# Patient Record
Sex: Male | Born: 1970 | Race: Black or African American | Hispanic: No | Smoking: Current every day smoker
Health system: Southern US, Community
[De-identification: ages and names within clinical notes are randomized; demographics above are authoritative.]

## PROBLEM LIST (undated history)

## (undated) DIAGNOSIS — N189 Chronic kidney disease, unspecified: Secondary | ICD-10-CM

## (undated) DIAGNOSIS — I10 Essential (primary) hypertension: Secondary | ICD-10-CM

## (undated) DIAGNOSIS — E7800 Pure hypercholesterolemia, unspecified: Secondary | ICD-10-CM

## (undated) DIAGNOSIS — I639 Cerebral infarction, unspecified: Secondary | ICD-10-CM

## (undated) HISTORY — DX: Chronic kidney disease, unspecified: N18.9

---

## 2001-03-08 ENCOUNTER — Emergency Department (HOSPITAL_COMMUNITY): Admission: EM | Admit: 2001-03-08 | Discharge: 2001-03-08 | Payer: Self-pay

## 2001-03-10 ENCOUNTER — Emergency Department (HOSPITAL_COMMUNITY): Admission: EM | Admit: 2001-03-10 | Discharge: 2001-03-10 | Payer: Self-pay | Admitting: Emergency Medicine

## 2002-07-23 ENCOUNTER — Emergency Department (HOSPITAL_COMMUNITY): Admission: EM | Admit: 2002-07-23 | Discharge: 2002-07-24 | Payer: Self-pay | Admitting: Emergency Medicine

## 2002-07-24 ENCOUNTER — Encounter: Payer: Self-pay | Admitting: Emergency Medicine

## 2006-11-21 ENCOUNTER — Encounter: Payer: Self-pay | Admitting: Cardiology

## 2006-11-21 ENCOUNTER — Ambulatory Visit: Payer: Self-pay

## 2009-02-04 ENCOUNTER — Inpatient Hospital Stay (HOSPITAL_COMMUNITY): Admission: EM | Admit: 2009-02-04 | Discharge: 2009-02-06 | Payer: Self-pay | Admitting: Emergency Medicine

## 2009-02-04 ENCOUNTER — Ambulatory Visit: Payer: Self-pay | Admitting: Family Medicine

## 2010-12-29 ENCOUNTER — Other Ambulatory Visit: Payer: Self-pay | Admitting: Cardiovascular Disease

## 2010-12-29 DIAGNOSIS — I10 Essential (primary) hypertension: Secondary | ICD-10-CM

## 2010-12-30 ENCOUNTER — Ambulatory Visit
Admission: RE | Admit: 2010-12-30 | Discharge: 2010-12-30 | Disposition: A | Discharge status: Home / Self Care | Payer: BC Managed Care – PPO | Source: Ambulatory Visit | Attending: Cardiovascular Disease | Admitting: Cardiovascular Disease

## 2010-12-30 ENCOUNTER — Other Ambulatory Visit: Payer: Self-pay | Admitting: Cardiovascular Disease

## 2010-12-30 DIAGNOSIS — I10 Essential (primary) hypertension: Secondary | ICD-10-CM

## 2011-01-22 ENCOUNTER — Inpatient Hospital Stay (HOSPITAL_COMMUNITY)
Admission: EM | Admit: 2011-01-22 | Discharge: 2011-01-24 | DRG: 014 | Disposition: A | Discharge status: Home / Self Care | Payer: BC Managed Care – HMO | Attending: Internal Medicine | Admitting: Internal Medicine

## 2011-01-22 ENCOUNTER — Emergency Department (HOSPITAL_COMMUNITY): Payer: BC Managed Care – HMO

## 2011-01-22 DIAGNOSIS — I10 Essential (primary) hypertension: Secondary | ICD-10-CM | POA: Diagnosis present

## 2011-01-22 DIAGNOSIS — Z794 Long term (current) use of insulin: Secondary | ICD-10-CM

## 2011-01-22 DIAGNOSIS — F17200 Nicotine dependence, unspecified, uncomplicated: Secondary | ICD-10-CM | POA: Diagnosis present

## 2011-01-22 DIAGNOSIS — E785 Hyperlipidemia, unspecified: Secondary | ICD-10-CM | POA: Diagnosis present

## 2011-01-22 DIAGNOSIS — I6350 Cerebral infarction due to unspecified occlusion or stenosis of unspecified cerebral artery: Principal | ICD-10-CM | POA: Diagnosis present

## 2011-01-22 DIAGNOSIS — E119 Type 2 diabetes mellitus without complications: Secondary | ICD-10-CM | POA: Diagnosis present

## 2011-01-22 DIAGNOSIS — Z7982 Long term (current) use of aspirin: Secondary | ICD-10-CM

## 2011-01-22 LAB — CK TOTAL AND CKMB (NOT AT ARMC)
CK, MB: 1.1 ng/mL (ref 0.3–4.0)
CK, MB: 1 ng/mL (ref 0.3–4.0)
Relative Index: 0.7 (ref 0.0–2.5)
Relative Index: 0.7 (ref 0.0–2.5)
Total CK: 165 U/L (ref 7–232)
Total CK: 143 U/L (ref 7–232)

## 2011-01-22 LAB — DIFFERENTIAL
Basophils Absolute: 0 10*3/uL (ref 0.0–0.1)
Eosinophils Relative: 2 % (ref 0–5)
Lymphocytes Relative: 18 % (ref 12–46)
Lymphs Abs: 1.8 10*3/uL (ref 0.7–4.0)
Neutro Abs: 7.4 10*3/uL (ref 1.7–7.7)

## 2011-01-22 LAB — COMPREHENSIVE METABOLIC PANEL
AST: 17 U/L (ref 0–37)
Albumin: 3.4 g/dL — ABNORMAL LOW (ref 3.5–5.2)
BUN: 22 mg/dL (ref 6–23)
Calcium: 8.9 mg/dL (ref 8.4–10.5)
Chloride: 106 mEq/L (ref 96–112)
Creatinine, Ser: 1.47 mg/dL (ref 0.4–1.5)
GFR calc Af Amer: >60 mL/min (ref >60)
Total Bilirubin: 0.6 mg/dL (ref 0.3–1.2)
Total Protein: 6.4 g/dL (ref 6.0–8.3)

## 2011-01-22 LAB — CBC
HCT: 36.8 % — ABNORMAL LOW (ref 39.0–52.0)
Hemoglobin: 12.8 g/dL — ABNORMAL LOW (ref 13.0–17.0)
MCV: 90 fL (ref 78.0–100.0)
RDW: 12.8 % (ref 11.5–15.5)
WBC: 9.7 10*3/uL (ref 4.0–10.5)

## 2011-01-22 LAB — APTT: aPTT: 23 seconds — ABNORMAL LOW (ref 24–37)

## 2011-01-22 LAB — PROTIME-INR: INR: 0.93 (ref 0.00–1.49)

## 2011-01-22 LAB — TROPONIN I: Troponin I: 0.02 ng/mL (ref 0.00–0.06)

## 2011-01-23 ENCOUNTER — Inpatient Hospital Stay (HOSPITAL_COMMUNITY): Payer: BC Managed Care – HMO

## 2011-01-23 DIAGNOSIS — I6789 Other cerebrovascular disease: Secondary | ICD-10-CM

## 2011-01-23 LAB — LIPID PANEL
Cholesterol: 109 mg/dL (ref 0–200)
Total CHOL/HDL Ratio: 3.2 RATIO
VLDL: 19 mg/dL (ref 0–40)

## 2011-01-23 LAB — CBC
MCH: 31.5 pg (ref 26.0–34.0)
MCHC: 35.2 g/dL (ref 30.0–36.0)
MCV: 89.3 fL (ref 78.0–100.0)
Platelets: 256 10*3/uL (ref 150–400)
RBC: 3.91 MIL/uL — ABNORMAL LOW (ref 4.22–5.81)

## 2011-01-23 LAB — HEMOGLOBIN A1C
Hgb A1c MFr Bld: 10.7 % — ABNORMAL HIGH (ref <5.7)
Mean Plasma Glucose: 260 mg/dL — ABNORMAL HIGH (ref <117)

## 2011-01-23 LAB — RAPID URINE DRUG SCREEN, HOSP PERFORMED
Amphetamines: NOT DETECTED
Cocaine: NOT DETECTED
Opiates: NOT DETECTED
Tetrahydrocannabinol: POSITIVE — AB

## 2011-01-23 LAB — BASIC METABOLIC PANEL
BUN: 15 mg/dL (ref 6–23)
Calcium: 8.7 mg/dL (ref 8.4–10.5)
Chloride: 109 mEq/L (ref 96–112)
Creatinine, Ser: 1.28 mg/dL (ref 0.4–1.5)
GFR calc Af Amer: >60 mL/min (ref >60)

## 2011-01-23 LAB — GLUCOSE, CAPILLARY
Glucose-Capillary: 139 mg/dL — ABNORMAL HIGH (ref 70–99)
Glucose-Capillary: 95 mg/dL (ref 70–99)
Glucose-Capillary: 82 mg/dL (ref 70–99)

## 2011-01-23 LAB — C3 COMPLEMENT: C3 Complement: 125 mg/dL (ref 88–201)

## 2011-01-23 LAB — C4 COMPLEMENT: Complement C4, Body Fluid: 34 mg/dL (ref 16–47)

## 2011-01-24 LAB — LUPUS ANTICOAGULANT PANEL
DRVVT: 50.7 secs — ABNORMAL HIGH (ref 36.2–44.3)
Lupus Anticoagulant: NOT DETECTED
PTT Lupus Anticoagulant: 34.7 secs (ref 30.0–45.6)

## 2011-01-24 LAB — HOMOCYSTEINE: Homocysteine: 14.8 umol/L (ref 4.0–15.4)

## 2011-01-24 LAB — ANA: Anti Nuclear Antibody(ANA): NEGATIVE

## 2011-01-24 LAB — HIV ANTIBODY (ROUTINE TESTING W REFLEX): HIV: NONREACTIVE

## 2011-01-26 LAB — FACTOR 5 LEIDEN

## 2011-01-26 LAB — COMPLEMENT, TOTAL: Compl, Total (CH50): >60 U/mL — ABNORMAL HIGH (ref 31–60)

## 2011-01-26 LAB — BETA-2-GLYCOPROTEIN I ABS, IGG/M/A: Beta-2 Glyco I IgG: 0 G Units (ref <20)

## 2011-01-26 LAB — PROTEIN C, TOTAL: Protein C, Total: >150 % — ABNORMAL HIGH (ref 70–140)

## 2011-01-26 LAB — PROTEIN S, TOTAL: Protein S Ag, Total: 127 % (ref 70–140)

## 2011-01-29 NOTE — Consult Note (Signed)
NAME:  RAYMON, GATEWOOD NO.:  [id number]  MEDICAL RECORD NO.:  [id number]           PATIENT TYPE:  I  LOCATION:  3037                         FACILITY:  [facility]  PHYSICIAN:  Alexis Goodell, MD    DATE OF BIRTH:  [birth year]  DATE OF CONSULTATION:  01/22/2011 DATE OF DISCHARGE:                                CONSULTATION   HISTORY:  Mr. Haser is a 40 year old male that was last seen normal approximately 1:00 a.m. in the morning.  He went to sleep about that time.  Awakened about at 11:00 a.m. today.  Was not feeling right on the right side and was having some dizziness as well.  Was noted by family members while in the bathroom to be making a lot of noise and did not handle himself appropriately.  When they went to him he was having some right-sided weakness.  They decided to eat at that time and laid down again.  When they awakened later his symptoms had not improved and they presented for evaluation.  Code stroke was called for the patient while en route.  PAST MEDICAL HISTORY: 1. Hypertension. 2. Diabetes. 3. Hypercholesterolemia.  MEDICATIONS: 1. Amlodipine. 2. Clonidine. 3. Lantus. 4. Lisinopril. 5. Metoprolol. 6. Pravastatin  ALLERGIES:  NO KNOWN DRUG ALLERGIES.  SOCIAL HISTORY:  The patient works in Psychologist, educational.  He smokes.  He drinks on occasion and had quite a bit of alcohol this weekend.  He smokes marijuana.  PHYSICAL EXAMINATION:  VITAL SIGNS:  Blood pressure 142/93, heart rate 72, respiratory rate 20, temperature 99, O2 sat 100% on room air. On mental status testing, the patient is alert and oriented.  He can follow commands without difficulty.  Speech is fluent.  There is some mild dysarthria.  On cranial nerve testing, II disk flat bilaterally. Visual fields grossly intact.  III, IV and VI extraocular movements intact.  V and VII right facial droop.  VIII grossly intact.  IX and VII positive gag.  XI bilateral shoulder shrug.  XII  midline tongue extension.  On motor exam, the patient has 5-/5 strength on the right. He has 5/5 on the left.  On sensory testing, pinprick and light touch are intact bilaterally.  Deep tendon reflexes are 3+ in the right upper extremity and 2+ otherwise.  Plantars are downgoing on the left and equivocal on the right.  On cerebellar testing, finger-to-nose and heel- to-shin intact with only some difficulty noted on the right upper extremity secondary to strength.  LABORATORY DATA:  Platelet count 265,000, white blood cell count 9.7, hemoglobin and hematocrit 12.8 and 36.8 respectively.  PT, INR and PTT are 12.7, 0.93 and 23.  CBG 139.  Head CT of the brain shows no acute changes.  ASSESSMENT:  Mr. Plona is a 40 year old male that presents with mostly motor findings on exam.  He likely has a lacunar event.  Presenting NIH stroke scale of 5.  Premorbid ranking scale of zero.  Due to his head CT being unremarkable, the patient is presumed to have a subcortical event that is likely small vessel in nature and related to his  risk factors of diabetes, hypertension and tobacco abuse.  He is on no anticoagulant at home.  He is not a t-PA candidate secondary to the time of presentation being greater than 12 hours.  PLAN: 1. Beta blocker for BP control. 2. Enteric-coated aspirin 325 mg daily for stroke prophylaxis. 3. Echo and carotid doppler. 4. PT, OT consult. 5. Swallow evaluation. 6. Smoking cessation.          ______________________________ Alexis Goodell, MD     LR/MEDQ  D:  01/22/2011  T:  01/23/2011  Job:  [id number]  Electronically Signed by Alexis Goodell MD on 01/29/2011 11:16:13 PM

## 2011-01-31 ENCOUNTER — Ambulatory Visit: Payer: BC Managed Care – PPO | Attending: Cardiovascular Disease | Admitting: Physical Therapy

## 2011-01-31 DIAGNOSIS — IMO0001 Reserved for inherently not codable concepts without codable children: Secondary | ICD-10-CM | POA: Insufficient documentation

## 2011-01-31 DIAGNOSIS — R269 Unspecified abnormalities of gait and mobility: Secondary | ICD-10-CM | POA: Insufficient documentation

## 2011-01-31 DIAGNOSIS — I69998 Other sequelae following unspecified cerebrovascular disease: Secondary | ICD-10-CM | POA: Insufficient documentation

## 2011-01-31 DIAGNOSIS — M6281 Muscle weakness (generalized): Secondary | ICD-10-CM | POA: Insufficient documentation

## 2011-01-31 NOTE — H&P (Signed)
NAME:  Keith [name], Keith [name] NO.:  [id number]  MEDICAL RECORD NO.:  [id number]           PATIENT TYPE:  I  LOCATION:  3037                         FACILITY:  [facility]  PHYSICIAN:  Kathie Dike, MD     DATE OF BIRTH:  1971/11/19  DATE OF ADMISSION:  01/22/2011 DATE OF DISCHARGE:                             HISTORY & PHYSICAL   PRIMARY CARE PHYSICIAN:  Birdie Riddle, MD  CHIEF COMPLAINT:  Right-sided weakness.  HISTORY OF PRESENT ILLNESS:  This is a 40 year old gentleman who presents to the ER with right-sided weakness.  The patient awoke with his symptoms this morning.  He was last found to be normal when he went to bed last night.  He describes right upper extremity and lower extremity weakness.  He also has some dizziness.  He has some numbness in the tips of his finger on his right hand.  He denies any changes in vision.  He has not had any headache.  His family noted that he was slurring his speech and had some right-sided facial droop.  He reported to the ER and code stroke was called.  He was seen by Dr. Doy Mince from Neurology and was not a candidate for TPA since he was last seen normal the night before and was out of the window.  He has been referred for further admission.  PAST MEDICAL HISTORY: 1. Hypertension. 2. Insulin-dependent diabetes. 3. Tobacco abuse. 4. Hyperlipidemia.  ALLERGIES:  No known drug allergies.  MEDICATIONS PRIOR TO ADMISSION: 1. Glipizide 10 mg twice daily. 2. Metformin 500 mg twice daily. 3. Clonidine 0.1 mg daily. 4. Lisinopril/hydrochlorothiazide 10/12.5 mg daily. 5. Pravachol 40 mg daily. 6. Lantus 30 units at bedtime. 7. Amlodipine 10 mg twice daily. 8. Metoprolol 50 mg twice daily.  FAMILY HISTORY:  The patient's mother had a stroke in her 61s.  High blood pressure and diabetes run in his family and is otherwise negative.  SOCIAL HISTORY:  The patient is an active smoker.  He does not clearly see how much he  smokes.  Says he drinks alcohol on occasion, but not daily.  Denies any illicit drug use.  REVIEW OF SYSTEMS:  All systems have been reviewed and pertinent positives are stated in the HPI.  PHYSICAL EXAMINATION:  VITAL SIGNS:  Blood pressure 135/90, heart rate of 72, respiratory rate of 17, temperature of 99, pulse ox 100% on room air. GENERAL:  The patient is in no acute distress, lying comfortably in bed. HEENT:  Normocephalic, atraumatic.  Pupils are equal, round, and reactive to light. NECK:  Supple. CHEST:  Clear to auscultation bilaterally. CARDIAC:  S1 and S2 with a regular rate and rhythm. ABDOMEN:  Soft, nontender.  Bowel sounds are active. EXTREMITIES:  No cyanosis, clubbing, or edema. NEUROLOGIC:  The patient is alert and oriented x3.  Speech is slightly dysarthric.  He does have a right-sided facial droop.  Strength is 5/5 bilaterally in the upper and lower extremities.  ASSESSMENT/PLAN: 1. Right-sided weakness, rule out cerebrovascular accident.  The     patient had a CT head that was negative in the  emergency room.  We     will admit him for further stroke workup including MRI, carotid     Dopplers, and 2-D echo.  He will be monitored on telemetry and     continued on aspirin for stroke prophylaxis.  He will be seen by PT     and OT. 2. Hypertension.  We will continue his outpatient medications. 3. Insulin-dependent diabetes.  We will continue his outpatient     regimen and supplement him with sliding scale. 4. Hyperlipidemia.  We will check a fasting lipid panel as well as     continue his statin.  Further orders will be per the clinical     course.     Kathie Dike, MD     JM/MEDQ  D:  01/22/2011  T:  01/23/2011  Job:  [id number]  cc:   Birdie Riddle, M.D.  Electronically Signed by Kathie Dike  on 01/31/2011 09:45:36 PM

## 2011-02-01 NOTE — Discharge Summary (Signed)
NAME:  Keith [name], Keith [name]                 ACCOUNT NO.:  [id number]  MEDICAL RECORD NO.:  [id number]           PATIENT TYPE:  I  LOCATION:  3037                         FACILITY:  [facility]  PHYSICIAN:  Estill Cotta, MD       DATE OF BIRTH:  25-Oct-1971  DATE OF ADMISSION:  01/22/2011 DATE OF DISCHARGE:  01/24/2011                              DISCHARGE SUMMARY   PRIMARY CARE PHYSICIAN:  Birdie Riddle, MD  DISCHARGE DIAGNOSES: 1. 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 2. Hypertension. 3. Diabetes mellitus. 4. Hyperlipidemia. 5. Nicotine use.  CONSULTATIONS:  Neurology Service, Dr. Leonie Man.  DISCHARGE MEDICATIONS: 1. Aspirin 325 mg p.o. daily. 2. Amlodipine 10 mg twice daily. 3. Clonidine 0.1 mg p.o. daily. 4. Glipizide 10 mg p.o. b.i.d. 5. Lantus 30 units subcu q.p.m. 6. Lisinopril and hydrochlorothiazide 10/12.5 one tablet p.o. daily. 7. Metformin 500 mg p.o. b.i.d. 8. Metoprolol 50 mg p.o. b.i.d. 9. Pravachol 40 mg p.o. daily.  BRIEF HISTORY OF PRESENT ILLNESS:  At the time of admission, Keith [name] is a 40 year old male who was admitted with history of diabetes, hypertension, hyperlipidemia, presented to the emergency room with right- sided weakness.  The patient awoke with this symptoms.  On the day of admission, he was found to be normal.  When he went to the bed at night before, he did have right upper extremity and lower extremity weakness with some dizziness and numbness in the tip of his fingers on his right hand.  He denied any changes in the vision.  His family noted that he was slurring his speech and had some right-sided facial droop.  The patient was not deemed as a candidate for TPA since he was last seen normal on night before and was out of window.  He was seen by Dr. Doy Mince from Neurology in the emergency room.  RADIOLOGICAL DATA:  CT head without contrast on March 4, normal CT of the head without contrast.  MRI of  the head showed one multifocal patchy acute infarcts in the left MCA territory, most pronounced at the basal ganglia.  There is a small associated focus of hemorrhage in the left central white matter.  No associated mass effect.  Negative MRI of head and brain, otherwise mild paranasal sinus inflammatory changes.  MRA, occluded left MCA at its region, left ACA region remains patent, no residual left MCA territory still observed.  Negative intracranial MRA. A 2D-echo on March 5, mild concentric LVH, EF of 60-65%, normal wall motion.  No evidence of thrombus in the atrial cavity or appendage of left atrium or right atrium.  No right-to-left atrial level shunt.  This includes a TEE as well.  Carotid Dopplers, no ICA stenosis, vertebral antegrade.  PERTINENT LAB DIAGNOSTIC DATA:  Hypercoagulable workup essentially was negative, HIV nonreactive, C3 and C4 normal, RPR nonreactive.  Urine drug screen did show THC, ESR 20.  Lipid profile; cholesterol 109, LDL 56, HbA1c 10.7 and a mean plasma glucose of 260.  BRIEF HOSPITALIZATION COURSE:  Keith [name] is a 40 year old male with multiple risk factors  including hypertension, hyperlipidemia and diabetes, smoking, presented with  left MCA infarct. 1. Acute left MCA CVA.  The patient was admitted to the Neurology     floor and underwent this total workup as dictated above.  The     patient was started on full-dose aspirin.  He also underwent     physical therapy evaluation and felt that outpatient CT is     recommended, rehab physical therapy will also be arranged.  Urine     drug screen was positive for THC.  The patient was counseled     strongly on smoking cessation and any drugs.  Vasculitis workup was     negative except for mildly elevated ESR at 20 with normal being     less than 16, C3 and C4 levels were essentially normal.  The     patient will be discharged home today.  He was continued on a     sliding-scale insulin and Lantus while  inpatient.  He will follow     up with his primary care physician Dr. Doylene Canard.  PHYSICAL EXAMINATION:  VITAL SIGNS:  At the time of the discharge; temperature 98.7, pulse 55, respirations 16, blood pressure 136/79, O2 sats 97% on room air. GENERAL:  The patient is alert, awake and oriented x3 not in acute distress. HEENT:  Anicteric sclerae.  Conjunctivae clear.  Pupils are reactive to light and accommodation.  EOMI.  No dysarthria. CVS:  S1 and S2 clear. CHEST:  Clear to auscultation bilaterally. ABDOMEN:  Soft, nontender, nondistended.  Normal bowel sounds.  Discharge followup with Dr. Doylene Canard in next 2 weeks and Dr. Antony Contras Neurology Service in next 3-4 weeks.  Discharge time 35 minutes.     Estill Cotta, MD     RR/MEDQ  D:  01/24/2011  T:  01/25/2011  Job:  [id number]  cc:   Pramod P. Leonie Man, MD Birdie Riddle, M.D.  Electronically Signed by Ramin Zoll  on 02/01/2011 07:51:31 AM

## 2011-02-03 NOTE — Consult Note (Signed)
NAME:  [name], [name] NO.:  [id number]  MEDICAL RECORD NO.:  [id number]           PATIENT TYPE:  I  LOCATION:  3037                         FACILITY:  [facility]  PHYSICIAN:  Roena Sassaman P. Leonie Man, MD    DATE OF BIRTH:  1971-05-15  DATE OF CONSULTATION: DATE OF DISCHARGE:                                CONSULTATION   REFERRING PHYSICIAN:  [facility]  REASON FOR REFERRAL:  Stroke.  HISTORY OF PRESENT ILLNESS:  Mr. Keith [name] is a 40 year old African American gentleman who was admitted with new-onset right-sided weakness.  The patient states he woke up in the morning at 11 a.m. with right-sided weakness.  He slept at 11 p.m. the night prior and states that he did not get up in the middle of the night.  He delayed coming to the hospital until later on in the afternoon by the time Dr. Doy Mince saw him on consultation.  It was more than 12 hours since his last seen normal, and he had only mild NIH stroke scale of 5, and hence he was not eligible for any of the stroke trials as well.  Code Stroke had been called en route, but after that time of onset was clearly determined to be beyond 12 hours, it was cancelled.  He has improved slightly since admission, but still has some weakness persisting, mainly in his hands. He has no prior history of stroke, TIA, seizures, significant neurological problems.  PAST MEDICAL HISTORY: 1. Hypertension. 2. Diabetes. 3. Hyperlipidemia. 4. Smoking.  HOME MEDICATIONS:  Amlodipine, clonidine, Lantus, lisinopril, metoprolol, pravastatin.  ALLERGIES TO MEDICATIONS:  None listed.  REVIEW OF SYSTEMS:  Not significant for any recent fever, cough, chest pain, diarrhea, or illness.  SOCIAL HISTORY:  The patient works in Psychologist, educational.  He admits to smoking marijuana and cigarettes, and occasionally alcohol.  He denies cocaine.  FAMILY HISTORY:  Not significant for anybody with neurological problems.  PHYSICAL EXAMINATION:  GENERAL:   Reveals pleasant young African American gentleman who appears currently not in distress. VITAL SIGNS:  Temperature 98.1, pulse rate 53 per minute and regular, respiratory rate 16 per minute, blood pressure 122/67 now, but on admission was elevated at 167/88.  Oxygen sats are 97% on room air. HEAD:  Nontraumatic. NECK:  Supple.  There is no bruit.  Hearing appears normal. CARDIAC EXAM:  No murmur or gallop. LUNGS:  Clear to auscultation. NEUROLOGICAL EXAM:  The patient is awake, alert, currently oriented to time, place, and person.  His speech and language appear normal.  He does have slight dysfluency, but can speak sentences, name, and repeat quite well.  There is no facial weakness.  He has full fields of vision bilaterally.  He has no upper extremity drift, but there is significant weakness of the right grip and intrinsic hand muscles.  He orbits the left over right upper extremity.  He has minimum right lower extremity drift and 4+/5 weakness of ankle dorsiflexors.  Coordination is slow, but accurate on the right.  On NIH stroke scale today, he scored only 1.  DATA REVIEWED:  The MRI scan of the brain  done earlier today shows patchy acute infarcts in the left middle cerebral artery distribution, mainly in the left basal ganglia plus some smaller areas in the left frontal cortex as well.  MRA of the brain shows abrupt occlusion of the left middle cerebral artery in the M1 portion.  LABORATORY DATA:  Total cholesterol is 109, triglyceride 95, HDL 34, LDL 56.  Hemoglobin A1c is elevated at 10.7 with a mean plasma glucose of 260.  Basic metabolic panel labs are unremarkable.  CBC is also normal.  IMPRESSION:  A 40 year old gentleman with left middle cerebral artery branch infarct secondary to left middle cerebral artery occlusion, etiology to be determined, but he clearly has multiple vascular risk factors of hypertension, diabetes, hyperlipidemia, and smoking.  PLAN:  Would  recommend continuing aspirin for now, check transesophageal echocardiogram for cardiac source of embolism as well as labs for vasculitis, HIV, and hypercoagulable panel given his young age. Physical and Occupational Therapy consults.  I have also counseled him to quit smoking.  Check urine drug screen as well.  I will be happy to follow the patient in consults.  Aggressive treatment of diabetes with hemoglobin A1c goal below 6.5.  I will be happy to follow the patient in consults.  Kindly call for questions.     Tewana Bohlen P. Leonie Man, MD     PPS/MEDQ  D:  01/23/2011  T:  01/24/2011  Job:  [id number]  Electronically Signed by Antony Contras MD on 02/03/2011 11:16:36 AM

## 2011-02-07 ENCOUNTER — Ambulatory Visit: Payer: BC Managed Care – PPO | Admitting: Physical Therapy

## 2011-02-10 ENCOUNTER — Ambulatory Visit: Payer: BC Managed Care – PPO | Admitting: Physical Therapy

## 2011-02-13 ENCOUNTER — Ambulatory Visit: Payer: BC Managed Care – PPO | Admitting: Physical Therapy

## 2011-02-13 ENCOUNTER — Ambulatory Visit: Payer: BC Managed Care – PPO | Admitting: Occupational Therapy

## 2011-02-15 ENCOUNTER — Ambulatory Visit: Payer: BC Managed Care – PPO | Admitting: Physical Therapy

## 2011-02-15 ENCOUNTER — Ambulatory Visit: Payer: BC Managed Care – PPO | Admitting: Occupational Therapy

## 2011-02-21 ENCOUNTER — Ambulatory Visit: Payer: BC Managed Care – PPO | Attending: Cardiovascular Disease | Admitting: Physical Therapy

## 2011-02-21 ENCOUNTER — Ambulatory Visit: Payer: BC Managed Care – PPO | Admitting: Occupational Therapy

## 2011-02-21 DIAGNOSIS — R269 Unspecified abnormalities of gait and mobility: Secondary | ICD-10-CM | POA: Insufficient documentation

## 2011-02-21 DIAGNOSIS — M6281 Muscle weakness (generalized): Secondary | ICD-10-CM | POA: Insufficient documentation

## 2011-02-21 DIAGNOSIS — IMO0001 Reserved for inherently not codable concepts without codable children: Secondary | ICD-10-CM | POA: Insufficient documentation

## 2011-02-21 DIAGNOSIS — I69998 Other sequelae following unspecified cerebrovascular disease: Secondary | ICD-10-CM | POA: Insufficient documentation

## 2011-02-22 ENCOUNTER — Ambulatory Visit: Payer: BC Managed Care – PPO | Admitting: Physical Therapy

## 2011-02-22 ENCOUNTER — Ambulatory Visit: Payer: BC Managed Care – PPO | Admitting: Occupational Therapy

## 2011-03-01 ENCOUNTER — Ambulatory Visit: Payer: BC Managed Care – PPO | Admitting: Physical Therapy

## 2011-03-01 ENCOUNTER — Ambulatory Visit: Payer: BC Managed Care – PPO | Admitting: Occupational Therapy

## 2011-03-02 LAB — URINE MICROSCOPIC-ADD ON

## 2011-03-02 LAB — URINALYSIS, ROUTINE W REFLEX MICROSCOPIC
Leukocytes, UA: NEGATIVE
Nitrite: NEGATIVE
Specific Gravity, Urine: 1.035 — ABNORMAL HIGH (ref 1.005–1.030)
pH: 5.5 (ref 5.0–8.0)

## 2011-03-02 LAB — POCT I-STAT 3, ART BLOOD GAS (G3+)
Patient temperature: 37
TCO2: 23 mmol/L (ref 0–100)
pCO2 arterial: 35 mmHg (ref 35.0–45.0)
pH, Arterial: 7.407 (ref 7.350–7.450)

## 2011-03-02 LAB — BASIC METABOLIC PANEL
BUN: 24 mg/dL — ABNORMAL HIGH (ref 6–23)
BUN: 26 mg/dL — ABNORMAL HIGH (ref 6–23)
CO2: 25 mEq/L (ref 19–32)
CO2: 26 mEq/L (ref 19–32)
Calcium: 9.1 mg/dL (ref 8.4–10.5)
Calcium: 9.2 mg/dL (ref 8.4–10.5)
Chloride: 101 mEq/L (ref 96–112)
Chloride: 103 mEq/L (ref 96–112)
Chloride: 93 mEq/L — ABNORMAL LOW (ref 96–112)
Creatinine, Ser: 1.75 mg/dL — ABNORMAL HIGH (ref 0.4–1.5)
Creatinine, Ser: 1.96 mg/dL — ABNORMAL HIGH (ref 0.4–1.5)
GFR calc Af Amer: 46 mL/min — ABNORMAL LOW (ref >60)
GFR calc Af Amer: 53 mL/min — ABNORMAL LOW (ref >60)
GFR calc Af Amer: >60 mL/min (ref >60)
Potassium: 3.4 mEq/L — ABNORMAL LOW (ref 3.5–5.1)
Sodium: 133 mEq/L — ABNORMAL LOW (ref 135–145)

## 2011-03-02 LAB — DIFFERENTIAL
Basophils Relative: 0 % (ref 0–1)
Lymphs Abs: 1.9 10*3/uL (ref 0.7–4.0)
Monocytes Absolute: 0.5 10*3/uL (ref 0.1–1.0)
Monocytes Relative: 5 % (ref 3–12)
Neutro Abs: 6.6 10*3/uL (ref 1.7–7.7)

## 2011-03-02 LAB — CBC
HCT: 38.2 % — ABNORMAL LOW (ref 39.0–52.0)
Hemoglobin: 13.2 g/dL (ref 13.0–17.0)
MCHC: 34.5 g/dL (ref 30.0–36.0)
MCHC: 34.7 g/dL (ref 30.0–36.0)
MCV: 93.4 fL (ref 78.0–100.0)
Platelets: 188 10*3/uL (ref 150–400)
RBC: 4.09 MIL/uL — ABNORMAL LOW (ref 4.22–5.81)
RDW: 12.9 % (ref 11.5–15.5)
WBC: 8.4 10*3/uL (ref 4.0–10.5)

## 2011-03-02 LAB — LIPID PANEL
Cholesterol: 207 mg/dL — ABNORMAL HIGH (ref 0–200)
Total CHOL/HDL Ratio: 6.9 RATIO
VLDL: 35 mg/dL (ref 0–40)

## 2011-03-02 LAB — COMPREHENSIVE METABOLIC PANEL
ALT: 33 U/L (ref 0–53)
Albumin: 4.4 g/dL (ref 3.5–5.2)
Alkaline Phosphatase: 104 U/L (ref 39–117)
BUN: 26 mg/dL — ABNORMAL HIGH (ref 6–23)
Calcium: 9.5 mg/dL (ref 8.4–10.5)
Potassium: 4.3 mEq/L (ref 3.5–5.1)
Sodium: 127 mEq/L — ABNORMAL LOW (ref 135–145)
Total Protein: 7.8 g/dL (ref 6.0–8.3)

## 2011-03-02 LAB — GLUCOSE, CAPILLARY
Glucose-Capillary: 222 mg/dL — ABNORMAL HIGH (ref 70–99)
Glucose-Capillary: 245 mg/dL — ABNORMAL HIGH (ref 70–99)
Glucose-Capillary: 370 mg/dL — ABNORMAL HIGH (ref 70–99)
Glucose-Capillary: 439 mg/dL — ABNORMAL HIGH (ref 70–99)
Glucose-Capillary: 99 mg/dL (ref 70–99)

## 2011-03-02 LAB — TSH: TSH: 1.433 u[IU]/mL (ref 0.350–4.500)

## 2011-03-02 LAB — C-PEPTIDE: C-Peptide: 1.81 ng/mL (ref 0.80–3.90)

## 2011-03-02 LAB — HEMOGLOBIN A1C

## 2011-03-02 LAB — GLYCOHEMOGLOBIN, TOTAL: Hemoglobin-A1c: 15.2 % — ABNORMAL HIGH (ref <9.0)

## 2011-03-03 ENCOUNTER — Ambulatory Visit: Payer: BC Managed Care – PPO | Admitting: Occupational Therapy

## 2011-03-03 ENCOUNTER — Ambulatory Visit: Payer: BC Managed Care – PPO | Admitting: Physical Therapy

## 2011-03-07 ENCOUNTER — Ambulatory Visit: Payer: BC Managed Care – PPO | Admitting: Occupational Therapy

## 2011-03-07 ENCOUNTER — Ambulatory Visit: Payer: BC Managed Care – PPO | Admitting: Physical Therapy

## 2011-03-09 ENCOUNTER — Ambulatory Visit: Payer: BC Managed Care – PPO | Admitting: Physical Therapy

## 2011-03-09 ENCOUNTER — Ambulatory Visit: Payer: BC Managed Care – PPO | Admitting: Occupational Therapy

## 2011-03-14 ENCOUNTER — Ambulatory Visit: Payer: BC Managed Care – PPO | Admitting: Physical Therapy

## 2011-03-14 ENCOUNTER — Ambulatory Visit: Payer: BC Managed Care – PPO | Admitting: Occupational Therapy

## 2011-03-16 ENCOUNTER — Ambulatory Visit: Payer: Self-pay | Admitting: Physical Therapy

## 2011-03-16 ENCOUNTER — Encounter: Payer: Self-pay | Admitting: Occupational Therapy

## 2011-03-17 ENCOUNTER — Ambulatory Visit: Payer: BC Managed Care – PPO | Admitting: Physical Therapy

## 2011-03-21 ENCOUNTER — Ambulatory Visit: Payer: BC Managed Care – PPO | Attending: Cardiovascular Disease | Admitting: Physical Therapy

## 2011-03-21 ENCOUNTER — Ambulatory Visit: Payer: BC Managed Care – PPO | Admitting: Occupational Therapy

## 2011-03-21 DIAGNOSIS — M6281 Muscle weakness (generalized): Secondary | ICD-10-CM | POA: Insufficient documentation

## 2011-03-21 DIAGNOSIS — I69998 Other sequelae following unspecified cerebrovascular disease: Secondary | ICD-10-CM | POA: Insufficient documentation

## 2011-03-21 DIAGNOSIS — IMO0001 Reserved for inherently not codable concepts without codable children: Secondary | ICD-10-CM | POA: Insufficient documentation

## 2011-03-21 DIAGNOSIS — R269 Unspecified abnormalities of gait and mobility: Secondary | ICD-10-CM | POA: Insufficient documentation

## 2011-03-23 ENCOUNTER — Ambulatory Visit: Payer: BC Managed Care – PPO | Admitting: Occupational Therapy

## 2011-03-23 ENCOUNTER — Ambulatory Visit: Payer: BC Managed Care – PPO | Admitting: Physical Therapy

## 2011-03-28 ENCOUNTER — Ambulatory Visit: Payer: BC Managed Care – PPO | Admitting: Occupational Therapy

## 2011-03-29 ENCOUNTER — Ambulatory Visit: Payer: BC Managed Care – PPO | Admitting: *Deleted

## 2011-04-04 ENCOUNTER — Ambulatory Visit: Payer: BC Managed Care – PPO | Admitting: Occupational Therapy

## 2011-04-04 NOTE — H&P (Signed)
NAME:  Keith [name], Keith [name] NO.:  [id number]   MEDICAL RECORD NO.:  [id number]          PATIENT TYPE:  INP   LOCATION:  [id number]                         FACILITY:  [facility]   PHYSICIAN:  Dalbert Mayotte, MD        DATE OF BIRTH:  09-09-1971   DATE OF ADMISSION:  02/04/2009  DATE OF DISCHARGE:                              HISTORY & PHYSICAL   CHIEF COMPLAINT:  Dizziness.   PRIMARY CARE PHYSICIAN:  Dr. Venancio Poisson.   HISTORY OF PRESENT ILLNESS:  This is a 40 year old male with history of  hypertension and no other medical problems who presents with dizziness,  blurry vision, fatigue, and decreased p.o. intake x1 week.  He also  associates this with polydipsia, polyuria, and a 10-pound weight loss.  The patient endorses having anorexia over the last several days.  He has  been drinking much more water recently as well.  He does endorse having  some crampy abdominal pain this morning and then vomiting and then  feeling nauseous once prior to coming to the ED.  Per the patient his  family convinced him to come to the ED for evaluation.  In the ED he was  found to have a glucose of 538 and family practice was called for an  admission.  The patient denies any preceding illness or feeling poorly  prior to this.  The patient denies ever having history of diabetes.   REVIEW OF SYSTEMS:  Negative for fevers, chills, diarrhea, constipation,  dysuria, cough, shortness of breath, chest pain, joint pain, hematuria,  melena, hematochezia.   PAST MEDICAL HISTORY:  1. Significant for hypertension.  2. Questionable hyperlipidemia.  No surgeries.   ALLERGIES:  None.   MEDICATIONS:  1. Lisinopril/HCTZ 10/12.5 mg one daily.  2. Metoprolol 50 mg daily.  3. Aspirin 81 mg daily.   SOCIAL HISTORY:  The patient reports losing his job in January of 2010.  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  The patient is married, lives in  [place].  He does have two children from a previous relationship,  two  girls, age 73 and 42 years old.  The patient has a pit bull at home.  The patient endorses smoking one mild cigar per day.  He also endorses  drinking a couple of mixed drinks per weekend, 2-3, and he endorses  occasional marijuana about one joint every other day.  No other  recreational drugs.   FAMILY HISTORY:  Significant for father with diabetes, hypertension, and  a stroke, and old age of 57s-70s, and a mother with history of stroke  who passed away from this at age 27.  No family history of heart disease  or cancer.   PHYSICAL EXAMINATION:  VITAL SIGNS:  Temperature 97.1, pulse 74-83,  respiratory rate 18, blood pressure 135-141 over 90-97, O2 saturation  100% on room air.  GENERAL:  African American male in no acute distress, sitting on exam  table in the CDU, alert and cooperative.  HEENT:  Pupils equally round and reactive to light.  Extraocular  movements intact.  Funduscopic exam shows  possible copper wiring.  Slightly dry mucous membranes.  No pharyngeal erythema or edema.  NECK:  No lymphadenopathy.  CARDIOVASCULAR:  Normal S1/S2.  No murmurs, rubs, or gallops  appreciated.  PULMONARY:  Clear to auscultation bilaterally.  No crackles or wheezing.  ABDOMEN:  Soft, nontender, nondistended.  No hepatosplenomegaly.  No  masses noted.  Normal active bowel sounds.  EXTREMITIES:  No clubbing, cyanosis, or edema, 2+ peripheral pulses.  SKIN:  No rash or jaundice noted.   LABORATORY DATA:  Ketones, serum negative.  White count 9.2, hemoglobin  16.5, platelets 188.  Sodium 127, potassium 4.3, chloride 91, bicarb 22,  BUN 26, creatinine 2.09, glucose 538, decreased to 424 after 0.5 L of  normal saline.  Calcium 9.5.  LFTs within normal limits.  Urinalysis  showing specific gravity 1.035, glucose greater than 1000, ketones of  15, and protein of 100.   Imaging:  Head CT showing no acute process.  The patient did receive 1 L  bolus in the ED and 10 mEq run of KCl IV.    ASSESSMENT/PLAN:  This is a 40 year old male with history of  hypertension who presents with blurry vision, polydipsia, polyuria, and  decreased p.o. intake along with weight loss and was found to be  hyperglycemic.  1. Hyperglycemia.  Likely etiology of the above symptoms.  Presumed      new onset diabetic.  Start on sliding scale insulin, bolus with 2 L      total normal saline, as he is very hypovolemic.  Start on D5 half      normal saline plus potassium.  Monitor CBGs q.4 h. overnight and      then check q.a.c. and bedtime.  Diabetic education.  The patient      currently without any acidosis and no anion gap.  Hopeful for trial      of p.o. antiglycemic agents and is likely type 2 diabetic, once his      sugars are better controlled.  2. Blurry vision.  Limited funduscopic exam today with evidence of      copper wiring changes but I did not see any diabetic proliferative      retinopathy.  He will need outpatient ophthalmology appointment.  3. Type 2 diabetes.  Diabetes education.  See #1.  4. Acute renal failure.  Rehydrate.  Likely prerenal from dehydration      and hypovolemia.  5. Hypertension.  Hold lisinopril.  Hydralazine p.r.n.  Continue      metoprolol.  6. FENGI.  Hyponatremia, potassium, repletion of fluid.  ADA diet.  7. Prophylaxis.  Heparin.   DISPOSITION:  Pending control of sugars and diabetes education.      Ria Bush, MD  Electronically Signed      Dalbert Mayotte, MD  Electronically Signed    JG/MEDQ  D:  02/04/2009  T:  02/05/2009  Job:  [id number]

## 2011-04-04 NOTE — Discharge Summary (Signed)
NAME:  Keith [name], Keith [name] NO.:  [id number]   MEDICAL RECORD NO.:  [id number]          PATIENT TYPE:  INP   LOCATION:  3703                         FACILITY:  [facility]   PHYSICIAN:  Blane Ohara McDiarmid, M.D.DATE OF BIRTH:  11/22/1970   DATE OF ADMISSION:  02/04/2009  DATE OF DISCHARGE:  02/06/2009                               DISCHARGE SUMMARY   DISCHARGE DIAGNOSES:  1. New onset type 2 diabetes.  2. Hypertension.  3. Hyperlipidemia.  4. Acute renal failure, resolved.   ADMITTING DIAGNOSES:  1. Hyperglycemia.  2. Hypertension.  3. Acute renal failure.   DISCHARGE MEDICATIONS:  1. Hydrochlorothiazide 12.5 mg p.o. daily.  2. Crestor 10 mg 1 tablet p.o. daily.  3. Glucotrol 10 mg 1 tablet p.o. daily.  4. Metoprolol 50 mg p.o. b.i.d.  5. Aspirin 81 mg 1 tablet p.o. daily.   SPECIAL INSTRUCTIONS TO THE PATIENT:  Please use Glucometer to check  blood sugars first thing in the morning, at lunch, and before bedtime.  The patient was instructed to bring machine or glucose numbers written  down to his primary care physician's office which is [place] on Monday.   FOLLOWUP:  The patient was instructed to go to [facility] on  Monday for blood work as well as to see his physician.  In addition, he  has a scheduled appointment with Dr. Lorelei Pont on Thursday February 11, 2009,  at 1:30 p.m.   The patient was instructed to stop taking his lisinopril and HCTZ  combination for now.   FOLLOWUP FOR PRIMARY CARE PHYSICIAN:  Please check a BMET to look at  creatinine on Monday February 08, 2009.  Based on his creatinine, he may be  able to start metformin.  A physician from the [facility] will call your office with the patient's glycohemoglobin as it  was pending at time of discharge.  If greater than 10, the patient will  need to resume insulin such as Lantus.  In addition, the patient was  instructed to bring in glucose log for review on Monday.  He has  an  another appointment on Thursday for further followup.   The patient should have a BMET on Monday February 08, 2009, to assess for  creatinine to help decide if metformin can be started as well as to  resume his lisinopril.   HOSPITAL COURSE:  This is a pleasant 40 year old man with hypertension  who is admitted with symptoms of dizziness, vision changes, fatigue, and  increased urination and was found to have elevated glucose on admission.  We decided based on laboratory values that the patient indeed has type 2  diabetes new onset.  He did well over his brief hospital stay.  Please  see following for details:  1. Hyperglycemia:  Diagnoses of type 2 diabetes based on glucose      greater than 200.  At its highest it was 538.  His glucose came      down very quickly with normal saline.  Lantus was started during      this hospital stay  at 15 units.  The patient did well on Lantus and      was transitioned to p.o. Glucotrol during his hospital stay.  A 24      hours prior to discharge, his highest glucose was 160 and the      majority of the sugars remained in the 90s including a fasting of      96.  It was felt that given this was new onset diabetes and his      quick response that we can hold off on sending the patient home on      insulin per his preference and began oral hyperglycemics.  However,      it is very important the patient follows up on Monday which is 2      days for now to show [place] that he has responded to the oral      medications and does not need a insulin regimen at this point.  He      went home on Glucotrol 10 mg p.o. daily.  A glycohemoglobin is      pending and if this number is above 10, he should be started on      insulin.  We feel that because of close followup he does not need      at this point.  His electrolytes corrected by the day of discharge      and on admission, he had hyponatremia.  He was never acidotic      during this time.  He did not have  ketones.  His A1c was unable to      be calculated in the typical fashion due to an abnormal blood      specimen.  The patient received diabetic education and was      instructed on how to use a Glucometer as well as how to take      insulin.  2. Hypertension:  His blood pressure remained fairly well controlled      during his hospital stay; however, he did have some elevated      numbners likely due to the fact that we held his HCTZ and      lisinopril combination due to his acute renal failure.  On the day      of discharge, we resumed his HCTZ and his home regimen at 12.5 mg.      However, we continued to hold his ACE inhibitor, which is      lisinopril due to his renal failure.  Although, his creatinine was      down to a normal level, it was just below normal and we felt the      patient could followup on Monday for a BMET to assess his      creatinine to resume his ACE inhibitor at that time.  On the day of      discharge, his blood pressure is 122/87.  He was continued on his      metoprolol throughout his hospital course.  3. Acute renal failure:  The patient came in with an elevated      creatinine at 2.09, which was at normal for him.  We felt that this      was due to dehydration secondary to hyperglycemia over a longer      period of time.  His creatinine came down very quickly with normal      saline and on the day of discharge, was in the upper limits are  normal at 1.48.  His HCTZ and lisinopril were held due to the renal      failure.  At time of discharge, we resumed his HCTZ; however,      continued to hold his ACE inhibitor.  This likely need to be      resumed on Monday February 08, 2009, at [place] appointment as long      as his creatinine is stable.  4. Hyperlipidemia:  The patient had a fasting lipid panel performed      during this hospital stay which showed an LDL of 142.  This is      above the guidelines recommended with a LDL of less than 100.  Also       new guidelines states that statins need to be started immediately      with elevated LDL and type 2 diabetics.  Therefore, we started      Crestor 10 mg p.o. daily.  This can be followed by his primary care      physician.  5. Blurry vision:  The patient complained of blurry vision on      admission.  We felt this is due to hyperglycemia; however, we      recommended outpatient ophthalmology appointment.  His primary care      physician can followup and help schedule this appointment.   PERTINENT LABORATORY DATA:  Urinalysis on admission showed glucose of  1000 and 15 ketones.  Comprehensive metabolic panel on admission on  February 04, 2009, showed glucose of 538, sodium of 127, and normal  potassium.  His creatinine is 2.09, otherwise unremarkable.  On the day  of discharge, his creatinine has come down to 1.48, sodium 133, and  potassium 3.4.  CBC unremarkable with normal hemoglobin at discharge of  13.2.  Lipid profile, total cholesterol 207, triglycerides 176, HDL 30,  LDL 142, dLDL 35, C-peptide 1.81, this is normal.  A1c unable to obtain  by typical fashion; however, total glycohemoglobin is pending at this  point.  TSH 1.433.  Normal ketones.  Serum negative.   IMAGING:  CT of the head was performed which shows no acute intracranial  findings or mass lesions.   This patient was discharge in improved and stable condition with no  restrictions on activity and he was instructed to follow low fat, and  carbohydrate diet.      Kasandra Knudsen, M.D.  Electronically Signed      Blane Ohara McDiarmid, M.D.  Electronically Signed    JT/MEDQ  D:  02/06/2009  T:  02/07/2009  Job:  [id number]   cc:   Orma Flaming, M.D.

## 2011-04-06 ENCOUNTER — Ambulatory Visit: Payer: BC Managed Care – PPO | Admitting: Rehabilitative and Restorative Service Providers"

## 2011-04-06 ENCOUNTER — Ambulatory Visit: Payer: BC Managed Care – PPO | Admitting: Occupational Therapy

## 2011-04-07 ENCOUNTER — Ambulatory Visit: Payer: BC Managed Care – PPO | Admitting: *Deleted

## 2011-04-11 ENCOUNTER — Ambulatory Visit: Payer: BC Managed Care – PPO | Admitting: Physical Therapy

## 2011-04-11 ENCOUNTER — Ambulatory Visit: Payer: BC Managed Care – PPO | Admitting: Occupational Therapy

## 2011-04-13 ENCOUNTER — Ambulatory Visit: Payer: BC Managed Care – PPO | Admitting: Occupational Therapy

## 2011-04-18 ENCOUNTER — Ambulatory Visit: Payer: BC Managed Care – PPO | Admitting: Occupational Therapy

## 2011-04-20 ENCOUNTER — Encounter: Payer: BC Managed Care – PPO | Admitting: Occupational Therapy

## 2011-04-25 ENCOUNTER — Ambulatory Visit: Payer: BC Managed Care – PPO | Attending: Cardiovascular Disease | Admitting: Occupational Therapy

## 2011-04-25 DIAGNOSIS — R269 Unspecified abnormalities of gait and mobility: Secondary | ICD-10-CM | POA: Insufficient documentation

## 2011-04-25 DIAGNOSIS — I69998 Other sequelae following unspecified cerebrovascular disease: Secondary | ICD-10-CM | POA: Insufficient documentation

## 2011-04-25 DIAGNOSIS — IMO0001 Reserved for inherently not codable concepts without codable children: Secondary | ICD-10-CM | POA: Insufficient documentation

## 2011-04-25 DIAGNOSIS — M6281 Muscle weakness (generalized): Secondary | ICD-10-CM | POA: Insufficient documentation

## 2011-04-27 ENCOUNTER — Ambulatory Visit: Payer: BC Managed Care – PPO | Admitting: Occupational Therapy

## 2011-05-02 ENCOUNTER — Ambulatory Visit: Payer: BC Managed Care – PPO | Admitting: Occupational Therapy

## 2011-05-04 ENCOUNTER — Encounter: Payer: BC Managed Care – PPO | Admitting: Occupational Therapy

## 2011-05-08 ENCOUNTER — Ambulatory Visit: Payer: BC Managed Care – PPO | Admitting: Occupational Therapy

## 2011-05-09 ENCOUNTER — Ambulatory Visit: Payer: BC Managed Care – PPO | Admitting: Occupational Therapy

## 2011-05-15 ENCOUNTER — Ambulatory Visit: Payer: BC Managed Care – PPO | Admitting: Occupational Therapy

## 2011-05-17 ENCOUNTER — Ambulatory Visit: Payer: BC Managed Care – PPO | Admitting: Occupational Therapy

## 2011-05-22 ENCOUNTER — Encounter: Payer: BC Managed Care – PPO | Admitting: Occupational Therapy

## 2011-05-25 ENCOUNTER — Encounter: Payer: BC Managed Care – PPO | Admitting: Occupational Therapy

## 2011-09-02 ENCOUNTER — Emergency Department (HOSPITAL_COMMUNITY): Payer: Medicaid Other

## 2011-09-02 ENCOUNTER — Emergency Department (HOSPITAL_COMMUNITY)
Admission: EM | Admit: 2011-09-02 | Discharge: 2011-09-02 | Disposition: A | Discharge status: Home / Self Care | Payer: Medicaid Other | Attending: Emergency Medicine | Admitting: Emergency Medicine

## 2011-09-02 DIAGNOSIS — Z7982 Long term (current) use of aspirin: Secondary | ICD-10-CM | POA: Insufficient documentation

## 2011-09-02 DIAGNOSIS — E789 Disorder of lipoprotein metabolism, unspecified: Secondary | ICD-10-CM | POA: Insufficient documentation

## 2011-09-02 DIAGNOSIS — Z8673 Personal history of transient ischemic attack (TIA), and cerebral infarction without residual deficits: Secondary | ICD-10-CM | POA: Insufficient documentation

## 2011-09-02 DIAGNOSIS — Z794 Long term (current) use of insulin: Secondary | ICD-10-CM | POA: Insufficient documentation

## 2011-09-02 DIAGNOSIS — I10 Essential (primary) hypertension: Secondary | ICD-10-CM | POA: Insufficient documentation

## 2011-09-02 DIAGNOSIS — M25569 Pain in unspecified knee: Secondary | ICD-10-CM | POA: Insufficient documentation

## 2011-09-02 DIAGNOSIS — E119 Type 2 diabetes mellitus without complications: Secondary | ICD-10-CM | POA: Insufficient documentation

## 2011-09-02 DIAGNOSIS — Z79899 Other long term (current) drug therapy: Secondary | ICD-10-CM | POA: Insufficient documentation

## 2012-02-21 ENCOUNTER — Other Ambulatory Visit: Payer: Self-pay | Admitting: Cardiovascular Disease

## 2012-02-21 DIAGNOSIS — R51 Headache: Secondary | ICD-10-CM

## 2012-02-22 ENCOUNTER — Ambulatory Visit
Admission: RE | Admit: 2012-02-22 | Discharge: 2012-02-22 | Disposition: A | Discharge status: Home / Self Care | Payer: Medicaid Other | Source: Ambulatory Visit | Attending: Cardiovascular Disease | Admitting: Cardiovascular Disease

## 2012-02-22 DIAGNOSIS — R51 Headache: Secondary | ICD-10-CM

## 2012-03-10 ENCOUNTER — Encounter (HOSPITAL_COMMUNITY): Payer: Self-pay | Admitting: *Deleted

## 2012-03-10 ENCOUNTER — Inpatient Hospital Stay (HOSPITAL_COMMUNITY): Payer: Medicaid Other

## 2012-03-10 ENCOUNTER — Inpatient Hospital Stay (HOSPITAL_COMMUNITY)
Admission: EM | Admit: 2012-03-10 | Discharge: 2012-03-13 | DRG: 638 | Disposition: A | Discharge status: Home / Self Care | Payer: Medicaid Other | Attending: Cardiology | Admitting: Cardiology

## 2012-03-10 DIAGNOSIS — I69959 Hemiplegia and hemiparesis following unspecified cerebrovascular disease affecting unspecified side: Secondary | ICD-10-CM

## 2012-03-10 DIAGNOSIS — E876 Hypokalemia: Secondary | ICD-10-CM | POA: Diagnosis present

## 2012-03-10 DIAGNOSIS — E7800 Pure hypercholesterolemia, unspecified: Secondary | ICD-10-CM | POA: Diagnosis present

## 2012-03-10 DIAGNOSIS — Z794 Long term (current) use of insulin: Secondary | ICD-10-CM

## 2012-03-10 DIAGNOSIS — Z7982 Long term (current) use of aspirin: Secondary | ICD-10-CM

## 2012-03-10 DIAGNOSIS — D649 Anemia, unspecified: Secondary | ICD-10-CM | POA: Diagnosis present

## 2012-03-10 DIAGNOSIS — Z91199 Patient's noncompliance with other medical treatment and regimen due to unspecified reason: Secondary | ICD-10-CM

## 2012-03-10 DIAGNOSIS — F17200 Nicotine dependence, unspecified, uncomplicated: Secondary | ICD-10-CM | POA: Diagnosis present

## 2012-03-10 DIAGNOSIS — E1110 Type 2 diabetes mellitus with ketoacidosis without coma: Secondary | ICD-10-CM

## 2012-03-10 DIAGNOSIS — E1101 Type 2 diabetes mellitus with hyperosmolarity with coma: Principal | ICD-10-CM | POA: Diagnosis present

## 2012-03-10 DIAGNOSIS — F1210 Cannabis abuse, uncomplicated: Secondary | ICD-10-CM | POA: Diagnosis present

## 2012-03-10 DIAGNOSIS — E1100 Type 2 diabetes mellitus with hyperosmolarity without nonketotic hyperglycemic-hyperosmolar coma (NKHHC): Secondary | ICD-10-CM

## 2012-03-10 DIAGNOSIS — I10 Essential (primary) hypertension: Secondary | ICD-10-CM | POA: Diagnosis present

## 2012-03-10 DIAGNOSIS — Z9119 Patient's noncompliance with other medical treatment and regimen: Secondary | ICD-10-CM

## 2012-03-10 HISTORY — DX: Pure hypercholesterolemia, unspecified: E78.00

## 2012-03-10 HISTORY — DX: Essential (primary) hypertension: I10

## 2012-03-10 LAB — DIFFERENTIAL
Eosinophils Absolute: 0 10*3/uL (ref 0.0–0.7)
Eosinophils Absolute: 0.1 10*3/uL (ref 0.0–0.7)
Eosinophils Relative: 0 % (ref 0–5)
Lymphs Abs: 1.7 10*3/uL (ref 0.7–4.0)
Lymphs Abs: 3.1 10*3/uL (ref 0.7–4.0)
Monocytes Absolute: 0.5 10*3/uL (ref 0.1–1.0)
Monocytes Relative: 4 % (ref 3–12)
Monocytes Relative: 7 % (ref 3–12)
Neutrophils Relative %: 83 % — ABNORMAL HIGH (ref 43–77)
Neutrophils Relative %: 69 % (ref 43–77)

## 2012-03-10 LAB — GLUCOSE, CAPILLARY
Glucose-Capillary: >600 mg/dL (ref 70–99)
Glucose-Capillary: 211 mg/dL — ABNORMAL HIGH (ref 70–99)
Glucose-Capillary: 154 mg/dL — ABNORMAL HIGH (ref 70–99)

## 2012-03-10 LAB — AMYLASE: Amylase: 95 U/L (ref 0–105)

## 2012-03-10 LAB — COMPREHENSIVE METABOLIC PANEL
ALT: 14 U/L (ref 0–53)
AST: 10 U/L (ref 0–37)
Albumin: 4.1 g/dL (ref 3.5–5.2)
Alkaline Phosphatase: 93 U/L (ref 39–117)
CO2: 17 mEq/L — ABNORMAL LOW (ref 19–32)
Calcium: 10.2 mg/dL (ref 8.4–10.5)
Chloride: 98 mEq/L (ref 96–112)
Creatinine, Ser: 1.67 mg/dL — ABNORMAL HIGH (ref 0.50–1.35)
GFR calc Af Amer: 48 mL/min — ABNORMAL LOW (ref >90)
GFR calc non Af Amer: 41 mL/min — ABNORMAL LOW (ref >90)
Glucose, Bld: 941 mg/dL (ref 70–99)
Potassium: 3.7 mEq/L (ref 3.5–5.1)
Sodium: 125 mEq/L — ABNORMAL LOW (ref 135–145)
Total Bilirubin: 0.3 mg/dL (ref 0.3–1.2)

## 2012-03-10 LAB — HEMOGLOBIN A1C: Hgb A1c MFr Bld: 12.7 % — ABNORMAL HIGH (ref <5.7)

## 2012-03-10 LAB — LIPID PANEL
Cholesterol: 159 mg/dL (ref 0–200)
Triglycerides: 197 mg/dL — ABNORMAL HIGH (ref <150)

## 2012-03-10 LAB — CBC
HCT: 39.8 % (ref 39.0–52.0)
Hemoglobin: 13.8 g/dL (ref 13.0–17.0)
Hemoglobin: 12.5 g/dL — ABNORMAL LOW (ref 13.0–17.0)
MCH: 31.4 pg (ref 26.0–34.0)
MCH: 31 pg (ref 26.0–34.0)
MCHC: 35.4 g/dL (ref 30.0–36.0)
MCV: 90.5 fL (ref 78.0–100.0)
Platelets: 276 10*3/uL (ref 150–400)
RBC: 4.4 MIL/uL (ref 4.22–5.81)
RBC: 4.16 MIL/uL — ABNORMAL LOW (ref 4.22–5.81)
RBC: 4.05 MIL/uL — ABNORMAL LOW (ref 4.22–5.81)
WBC: 13.1 10*3/uL — ABNORMAL HIGH (ref 4.0–10.5)

## 2012-03-10 LAB — BLOOD GAS, ARTERIAL
Bicarbonate: 20 mEq/L (ref 20.0–24.0)
FIO2: 0.21 %
Patient temperature: 98.6
pCO2 arterial: 31.9 mmHg — ABNORMAL LOW (ref 35.0–45.0)
pH, Arterial: 7.413 (ref 7.350–7.450)
pO2, Arterial: 91.5 mmHg (ref 80.0–100.0)

## 2012-03-10 LAB — BASIC METABOLIC PANEL
Calcium: 9.5 mg/dL (ref 8.4–10.5)
GFR calc Af Amer: 55 mL/min — ABNORMAL LOW (ref >90)
GFR calc non Af Amer: 48 mL/min — ABNORMAL LOW (ref >90)
Glucose, Bld: 575 mg/dL (ref 70–99)
Sodium: 136 mEq/L (ref 135–145)

## 2012-03-10 LAB — CARDIAC PANEL(CRET KIN+CKTOT+MB+TROPI)
CK, MB: 1.9 ng/mL (ref 0.3–4.0)
Total CK: 177 U/L (ref 7–232)

## 2012-03-10 LAB — URINALYSIS, ROUTINE W REFLEX MICROSCOPIC
Bilirubin Urine: NEGATIVE
Glucose, UA: >1000 mg/dL — AB
Protein, ur: NEGATIVE mg/dL

## 2012-03-10 LAB — POCT I-STAT 3, ART BLOOD GAS (G3+)
Acid-base deficit: 8 mmol/L — ABNORMAL HIGH (ref 0.0–2.0)
Bicarbonate: 16.2 mEq/L — ABNORMAL LOW (ref 20.0–24.0)
O2 Saturation: 96 %
TCO2: 17 mmol/L (ref 0–100)
pO2, Arterial: 89 mmHg (ref 80.0–100.0)

## 2012-03-10 LAB — PROTIME-INR
INR: 1.11 (ref 0.00–1.49)
Prothrombin Time: 14.5 seconds (ref 11.6–15.2)

## 2012-03-10 LAB — LIPASE, BLOOD: Lipase: 45 U/L (ref 11–59)

## 2012-03-10 MED ORDER — INSULIN GLARGINE 100 UNIT/ML ~~LOC~~ SOLN: 30.0000 [IU] | Freq: Every day | SUBCUTANEOUS | Status: DC

## 2012-03-10 MED ORDER — SODIUM CHLORIDE 0.9 % IV SOLN
1000.0000 mL | INTRAVENOUS | Status: DC
  Administered 2012-03-10: 1000 mL via INTRAVENOUS

## 2012-03-10 MED ORDER — SODIUM CHLORIDE 0.9 % IV SOLN
INTRAVENOUS | Status: DC
  Administered 2012-03-10: 5.4 [IU]/h via INTRAVENOUS
  Filled 2012-03-10: qty 1

## 2012-03-10 MED ORDER — METOPROLOL TARTRATE 50 MG PO TABS
50.0000 mg | ORAL_TABLET | Freq: Two times a day (BID) | ORAL | Status: DC
  Administered 2012-03-10 – 2012-03-13 (×6): 50 mg via ORAL
  Filled 2012-03-10 (×7): qty 1

## 2012-03-10 MED ORDER — ASPIRIN EC 325 MG PO TBEC
325.0000 mg | DELAYED_RELEASE_TABLET | Freq: Every day | ORAL | Status: DC
  Administered 2012-03-11 – 2012-03-13 (×3): 325 mg via ORAL
  Filled 2012-03-10 (×4): qty 1

## 2012-03-10 MED ORDER — SODIUM CHLORIDE 0.9 % IV SOLN
1000.0000 mL | Freq: Once | INTRAVENOUS | Status: AC
  Administered 2012-03-10: 1000 mL via INTRAVENOUS

## 2012-03-10 MED ORDER — SODIUM CHLORIDE 0.9 % IV SOLN
INTRAVENOUS | Status: DC
  Administered 2012-03-10: 18:00:00 via INTRAVENOUS

## 2012-03-10 MED ORDER — SODIUM CHLORIDE 0.9 % IJ SOLN
3.0000 mL | Freq: Two times a day (BID) | INTRAMUSCULAR | Status: DC
  Administered 2012-03-11 – 2012-03-13 (×3): 3 mL via INTRAVENOUS

## 2012-03-10 MED ORDER — ALUM & MAG HYDROXIDE-SIMETH 200-200-20 MG/5ML PO SUSP: 30.0000 mL | Freq: Four times a day (QID) | ORAL | Status: DC | PRN

## 2012-03-10 MED ORDER — SIMVASTATIN 20 MG PO TABS
20.0000 mg | ORAL_TABLET | Freq: Every day | ORAL | Status: DC
  Administered 2012-03-10 – 2012-03-12 (×3): 20 mg via ORAL
  Filled 2012-03-10 (×4): qty 1

## 2012-03-10 MED ORDER — SODIUM CHLORIDE 0.9 % IV SOLN
INTRAVENOUS | Status: DC
  Administered 2012-03-10: 22:00:00 via INTRAVENOUS
  Administered 2012-03-11: 0.2 [IU]/h via INTRAVENOUS
  Administered 2012-03-11: 1.7 [IU]/h via INTRAVENOUS
  Administered 2012-03-11: 0.5 [IU]/h via INTRAVENOUS
  Administered 2012-03-11: 5 [IU]/h via INTRAVENOUS
  Filled 2012-03-10: qty 1

## 2012-03-10 MED ORDER — HEPARIN SODIUM (PORCINE) 5000 UNIT/ML IJ SOLN
5000.0000 [IU] | Freq: Three times a day (TID) | INTRAMUSCULAR | Status: DC
  Administered 2012-03-10 – 2012-03-11 (×4): 5000 [IU] via SUBCUTANEOUS
  Filled 2012-03-10 (×8): qty 1

## 2012-03-10 NOTE — ED Notes (Signed)
Placed call for Carb modified diet

## 2012-03-10 NOTE — ED Notes (Signed)
Potassium 6.4, glucose 941 called from lab, b nguyen pa notified

## 2012-03-10 NOTE — ED Notes (Signed)
Patient states he has not been vomiting ans not able to eat. Patient states he has not taken his DM med x 2 days.

## 2012-03-10 NOTE — ED Provider Notes (Signed)
Male with a history of diabetes and diabetic ketoacidosis states that he has been vomiting for the last 3 days and therefore has not been taking his insulin. He continues to have vomiting. Workup showed that he has indeed gone into ketoacidosis and he was started on treatment with hydration and insulin drip.   Date: 03/10/2012  Rate: 60  Rhythm: normal sinus rhythm  QRS Axis: normal  Intervals: normal   ST/T Wave abnormalities: normal  Conduction Disutrbances:none  Narrative Interpretation: Reversal of right and left arm leads is present. ECG is otherwise significant for left ventricular hypertrophy. When compared with ECG of 01/22/2011, right and left arm leads are now reversed.  Old EKG Reviewed: changes noted    Delora Fuel, MD [id number]

## 2012-03-10 NOTE — ED Notes (Signed)
attemtped to call report unable gave my number sts they will call back.

## 2012-03-10 NOTE — ED Notes (Signed)
Reports not feeling well, not eating, hasnt taken diabetes meds in 2 days, cbg read critical high pta.

## 2012-03-10 NOTE — ED Notes (Signed)
Cbg (HI) Rn notified Ashleigh

## 2012-03-10 NOTE — H&P (Signed)
Keith [name] is an 41 y.o. male.   Chief Complaint: Abdominal pain nausea vomiting HPI: Patient is 41 year old male with past medical history significant for left CVA with right paresis, hypertension, insulin requiring diabetes mellitus, tobacco abuse marijuana abuse, history of alcohol abuse, positive family history of stroke and diabetes came to the ER complaining of vague abdominal pain associated with nausea vomiting and no appetite for last few days. Patient states he stopped his insulin and diabetic medications and was noted to have blood sugar above 100 in ER. Patient denies any fever or chills denies any urinary complaints. Denies any chest pain or shortness of breath. Denies any palpitation lightheadedness or syncope. Denies any PND orthopnea leg swelling. Denies any further weakness in arms or legs or slurred speech. States occasionally has blurred vision which he attributes to fluctuating blood sugar. Patient received IV normal saline 2 L and was started on insulin drip with improvement in his symptoms  Past Medical History  Diagnosis Date  . Diabetes mellitus   . Hypertension     History reviewed. No pertinent past surgical history.  History reviewed. No pertinent family history. Social History:  reports that he has been smoking Cigarettes.  He does not have any smokeless tobacco history on file. He reports that he does not drink alcohol or use illicit drugs.  Allergies: No Known Allergies  Medications Prior to Admission  Medication Dose Route Frequency Provider Last Rate Last Dose  . 0.9 %  sodium chloride infusion  1,000 mL Intravenous Once Sheliah Mends, PA-C   1,000 mL at 03/10/12 1250   Followed by  . 0.9 %  sodium chloride infusion  1,000 mL Intravenous Once Sheliah Mends, PA-C   1,000 mL at 03/10/12 1343   Followed by  . 0.9 %  sodium chloride infusion  1,000 mL Intravenous Continuous Sheliah Mends, PA-C 125 mL/hr at 03/10/12 1529 1,000 mL at 03/10/12 1529  . insulin  regular (NOVOLIN R,HUMULIN R) 1 Units/mL in sodium chloride 0.9 % 100 mL infusion   Intravenous Continuous Sheliah Mends, PA-C 5.4 mL/hr at 03/10/12 1528 5.4 Units/hr at 03/10/12 1528  . insulin regular (NOVOLIN R,HUMULIN R) 1 Units/mL in sodium chloride 0.9 % 100 mL infusion   Intravenous Continuous Clent Demark, MD       Medications Prior to Admission  Medication Sig Dispense Refill  . amLODipine (NORVASC) 5 MG tablet Take 5 mg by mouth daily.      . cloNIDine (CATAPRES) 0.1 MG tablet Take 0.1 mg by mouth at bedtime.      Marland Kitchen glipiZIDE (GLUCOTROL XL) 5 MG 24 hr tablet Take 5 mg by mouth 2 (two) times daily.      . insulin glargine (LANTUS) 100 UNIT/ML injection Inject 30 Units into the skin at bedtime.      Marland Kitchen lisinopril (PRINIVIL,ZESTRIL) 2.5 MG tablet Take 2.5 mg by mouth every morning.      . metFORMIN (GLUCOPHAGE) 500 MG tablet Take 500 mg by mouth 2 (two) times daily with a meal.      . metoprolol (LOPRESSOR) 50 MG tablet Take 50 mg by mouth 2 (two) times daily.      . pravastatin (PRAVACHOL) 40 MG tablet Take 40 mg by mouth at bedtime.        Results for orders placed during the hospital encounter of 03/10/12 (from the past 48 hour(s))  GLUCOSE, CAPILLARY     Status: Abnormal   Collection Time   03/10/12 12:00 PM  Component Value Range Comment   Glucose-Capillary >600 (*) 70 - 99 (mg/dL)   CBC     Status: Abnormal   Collection Time   03/10/12 12:39 PM      Component Value Range Comment   WBC 13.4 (*) 4.0 - 10.5 (K/uL)    RBC 4.40  4.22 - 5.81 (MIL/uL)    Hemoglobin 13.8  13.0 - 17.0 (g/dL)    HCT 39.8  39.0 - 52.0 (%)    MCV 90.5  78.0 - 100.0 (fL)    MCH 31.4  26.0 - 34.0 (pg)    MCHC 34.7  30.0 - 36.0 (g/dL)    RDW 13.1  11.5 - 15.5 (%)    Platelets 296  150 - 400 (K/uL)   DIFFERENTIAL     Status: Abnormal   Collection Time   03/10/12 12:39 PM      Component Value Range Comment   Neutrophils Relative 83 (*) 43 - 77 (%)    Neutro Abs 11.2 (*) 1.7 - 7.7 (K/uL)     Lymphocytes Relative 12  12 - 46 (%)    Lymphs Abs 1.7  0.7 - 4.0 (K/uL)    Monocytes Relative 4  3 - 12 (%)    Monocytes Absolute 0.5  0.1 - 1.0 (K/uL)    Eosinophils Relative 0  0 - 5 (%)    Eosinophils Absolute 0.0  0.0 - 0.7 (K/uL)    Basophils Relative 0  0 - 1 (%)    Basophils Absolute 0.0  0.0 - 0.1 (K/uL)   COMPREHENSIVE METABOLIC PANEL     Status: Abnormal   Collection Time   03/10/12 12:39 PM      Component Value Range Comment   Sodium 125 (*) 135 - 145 (mEq/L)    Potassium 6.4 (*) 3.5 - 5.1 (mEq/L)    Chloride 83 (*) 96 - 112 (mEq/L)    CO2 17 (*) 19 - 32 (mEq/L)    Glucose, Bld 941 (*) 70 - 99 (mg/dL)    BUN 25 (*) 6 - 23 (mg/dL)    Creatinine, Ser 1.93 (*) 0.50 - 1.35 (mg/dL)    Calcium 10.2  8.4 - 10.5 (mg/dL)    Total Protein 8.4 (*) 6.0 - 8.3 (g/dL)    Albumin 4.6  3.5 - 5.2 (g/dL)    AST 11  0 - 37 (U/L)    ALT 14  0 - 53 (U/L)    Alkaline Phosphatase 93  39 - 117 (U/L)    Total Bilirubin 0.4  0.3 - 1.2 (mg/dL)    GFR calc non Af Amer 41 (*) >90 (mL/min)    GFR calc Af Amer 48 (*) >90 (mL/min)   URINALYSIS, ROUTINE W REFLEX MICROSCOPIC     Status: Abnormal   Collection Time   03/10/12  1:25 PM      Component Value Range Comment   Color, Urine YELLOW  YELLOW     APPearance CLEAR  CLEAR     Specific Gravity, Urine 1.028  1.005 - 1.030     pH 5.0  5.0 - 8.0     Glucose, UA >1000 (*) NEGATIVE (mg/dL)    Hgb urine dipstick TRACE (*) NEGATIVE     Bilirubin Urine NEGATIVE  NEGATIVE     Ketones, ur 40 (*) NEGATIVE (mg/dL)    Protein, ur NEGATIVE  NEGATIVE (mg/dL)    Urobilinogen, UA 0.2  0.0 - 1.0 (mg/dL)    Nitrite NEGATIVE  NEGATIVE  Leukocytes, UA NEGATIVE  NEGATIVE    URINE MICROSCOPIC-ADD ON     Status: Abnormal   Collection Time   03/10/12  1:25 PM      Component Value Range Comment   Squamous Epithelial / LPF FEW (*) RARE     RBC / HPF 0-2  <3 (RBC/hpf)   GLUCOSE, CAPILLARY     Status: Abnormal   Collection Time   03/10/12  2:47 PM      Component  Value Range Comment   Glucose-Capillary >600 (*) 70 - 99 (mg/dL)    No results found.  Review of Systems  Constitutional: Positive for weight loss. Negative for fever and chills.  Eyes: Positive for blurred vision.  Respiratory: Negative for cough, hemoptysis, sputum production and shortness of breath.   Cardiovascular: Negative for chest pain, palpitations, orthopnea, claudication and leg swelling.  Gastrointestinal: Positive for nausea, vomiting and abdominal pain.  Genitourinary: Negative for dysuria and urgency.  Neurological: Negative for dizziness, tingling and headaches.    Blood pressure 123/89, pulse 54, temperature 98.1 F (36.7 C), resp. rate 20, SpO2 99.00%. Physical Exam  Constitutional: He is oriented to person, place, and time. He appears well-developed.  HENT:  Head: Normocephalic and atraumatic.  Nose: Nose normal.  Mouth/Throat: No oropharyngeal exudate.  Eyes: Conjunctivae are normal. Pupils are equal, round, and reactive to light. Left eye exhibits no discharge. No scleral icterus.  Neck: Normal range of motion. Neck supple. No JVD present. No tracheal deviation present. No thyromegaly present.  Cardiovascular: Normal rate and regular rhythm.  Exam reveals no gallop and no friction rub.   Respiratory: Breath sounds normal. No respiratory distress. He has no wheezes. He has no rales. He exhibits no tenderness.  GI: Soft. Bowel sounds are normal. He exhibits no distension. There is no tenderness. There is no rebound and no guarding.  Musculoskeletal: He exhibits no edema and no tenderness.  Lymphadenopathy:    He has no cervical adenopathy.  Neurological: He is alert and oriented to person, place, and time.     Assessment/Plan Diabetic hyperosmolar nonketotic state secondary to noncompliance to meds Abdominal pain rule out cholelithiasis, rule out pancreatitis Hypertension Hypercholesteremia Tobacco abuse Marrow abuse History of alcohol abuse Positive  family history of CVA Plan As per orders Kendel Bessey N 03/10/2012, 3:40 PM

## 2012-03-10 NOTE — ED Notes (Signed)
MD at bedside. 

## 2012-03-10 NOTE — ED Notes (Signed)
Cbs Fallon Rn notified Lilia Pro

## 2012-03-10 NOTE — ED Provider Notes (Signed)
Medical screening examination/treatment/procedure(s) were conducted as a shared visit with non-physician practitioner(s) and myself.  I personally evaluated the patient during the encounter  CRITICAL CARE Performed by: [id number]   Total critical care time: 40 minutes  Critical care time was exclusive of separately billable procedures and treating other patients.  Critical care was necessary to treat or prevent imminent or life-threatening deterioration.  Critical care was time spent personally by me on the following activities: development of treatment plan with patient and/or surrogate as well as nursing, discussions with consultants, evaluation of patient's response to treatment, examination of patient, obtaining history from patient or surrogate, ordering and performing treatments and interventions, ordering and review of laboratory studies, ordering and review of radiographic studies, pulse oximetry and re-evaluation of patient's condition.   Delora Fuel, MD [id number]

## 2012-03-10 NOTE — ED Provider Notes (Signed)
History     CSN: [id number]  Arrival date & time 03/10/12  1148   First MD Initiated Contact with Patient 03/10/12 1221      HPI Patient reports he is here to to high blood sugar. States has not checked it and wants complete things. Reports for the last 2 days has had abdominal pain, nausea, vomiting. States because of this he is not taking his diabetic medication. Reports that his also had associated in polyuria and polydipsia. States known improvement in nausea and vomiting and when he saw his left ear he came immediately to the emergency department. Denies chest pain, shortness of breath, headache, neck pain, myalgias, leg cramps, urinary symptoms. States he did take his medication this morning but has ever the symptoms. The history is provided by the patient.    Past Medical History  Diagnosis Date  . Diabetes mellitus   . Hypertension     History reviewed. No pertinent past surgical history.  History reviewed. No pertinent family history.  History  Substance Use Topics  . Smoking status: Current Everyday Smoker    Types: Cigarettes  . Smokeless tobacco: Not on file  . Alcohol Use: No      Review of Systems  Constitutional: Negative for fever and chills.  Respiratory: Negative for shortness of breath.   Gastrointestinal: Positive for nausea, vomiting and abdominal pain. Negative for diarrhea, constipation and blood in stool.  Genitourinary: Positive for frequency. Negative for dysuria, hematuria, flank pain, discharge, penile pain and testicular pain.  Musculoskeletal: Negative for back pain.  Neurological: Negative for numbness.  All other systems reviewed and are negative.    Allergies  Review of patient's allergies indicates no known allergies.  Home Medications   Current Outpatient Rx  Name Route Sig Dispense Refill  . AMLODIPINE BESYLATE 5 MG PO TABS Oral Take 5 mg by mouth daily.    Marland Kitchen CLONIDINE HCL 0.1 MG PO TABS Oral Take 0.1 mg by mouth at bedtime.      Marland Kitchen GLIPIZIDE ER 5 MG PO TB24 Oral Take 5 mg by mouth 2 (two) times daily.    . INSULIN GLARGINE 100 UNIT/ML [place] SOLN Subcutaneous Inject 30 Units into the skin at bedtime.    Marland Kitchen LISINOPRIL 2.5 MG PO TABS Oral Take 2.5 mg by mouth every morning.    Marland Kitchen METFORMIN HCL 500 MG PO TABS Oral Take 500 mg by mouth 2 (two) times daily with a meal.    . METOPROLOL TARTRATE 50 MG PO TABS Oral Take 50 mg by mouth 2 (two) times daily.    Marland Kitchen PRAVASTATIN SODIUM 40 MG PO TABS Oral Take 40 mg by mouth at bedtime.      BP 123/89  Pulse 54  Temp 98.1 F (36.7 C)  Resp 20  SpO2 99%  Physical Exam  Constitutional: He is oriented to person, place, and time. He appears well-developed and well-nourished.       Eating chicken nuggets  HENT:  Head: Normocephalic and atraumatic.  Eyes: Conjunctivae are normal. Pupils are equal, round, and reactive to light.  Neck: Normal range of motion. Neck supple.  Cardiovascular: Normal rate, regular rhythm and normal heart sounds.   Pulmonary/Chest: Effort normal and breath sounds normal.  Abdominal: Soft. Bowel sounds are normal. He exhibits no distension and no mass. There is no tenderness. There is no rebound and no guarding.  Neurological: He is alert and oriented to person, place, and time.  Skin: Skin is warm and dry. No rash  noted. No erythema. No pallor.  Psychiatric: He has a normal mood and affect. His behavior is normal.    ED Course  Procedures   Results for orders placed during the hospital encounter of 03/10/12  GLUCOSE, CAPILLARY      Component Value Range   Glucose-Capillary >600 (*) 70 - 99 (mg/dL)  CBC      Component Value Range   WBC 13.4 (*) 4.0 - 10.5 (K/uL)   RBC 4.40  4.22 - 5.81 (MIL/uL)   Hemoglobin 13.8  13.0 - 17.0 (g/dL)   HCT 39.8  39.0 - 52.0 (%)   MCV 90.5  78.0 - 100.0 (fL)   MCH 31.4  26.0 - 34.0 (pg)   MCHC 34.7  30.0 - 36.0 (g/dL)   RDW 13.1  11.5 - 15.5 (%)   Platelets 296  150 - 400 (K/uL)  DIFFERENTIAL      Component Value  Range   Neutrophils Relative 83 (*) 43 - 77 (%)   Neutro Abs 11.2 (*) 1.7 - 7.7 (K/uL)   Lymphocytes Relative 12  12 - 46 (%)   Lymphs Abs 1.7  0.7 - 4.0 (K/uL)   Monocytes Relative 4  3 - 12 (%)   Monocytes Absolute 0.5  0.1 - 1.0 (K/uL)   Eosinophils Relative 0  0 - 5 (%)   Eosinophils Absolute 0.0  0.0 - 0.7 (K/uL)   Basophils Relative 0  0 - 1 (%)   Basophils Absolute 0.0  0.0 - 0.1 (K/uL)  URINALYSIS, ROUTINE W REFLEX MICROSCOPIC      Component Value Range   Color, Urine YELLOW  YELLOW    APPearance CLEAR  CLEAR    Specific Gravity, Urine 1.028  1.005 - 1.030    pH 5.0  5.0 - 8.0    Glucose, UA >1000 (*) NEGATIVE (mg/dL)   Hgb urine dipstick TRACE (*) NEGATIVE    Bilirubin Urine NEGATIVE  NEGATIVE    Ketones, ur 40 (*) NEGATIVE (mg/dL)   Protein, ur NEGATIVE  NEGATIVE (mg/dL)   Urobilinogen, UA 0.2  0.0 - 1.0 (mg/dL)   Nitrite NEGATIVE  NEGATIVE    Leukocytes, UA NEGATIVE  NEGATIVE   COMPREHENSIVE METABOLIC PANEL      Component Value Range   Sodium 125 (*) 135 - 145 (mEq/L)   Potassium 6.4 (*) 3.5 - 5.1 (mEq/L)   Chloride 83 (*) 96 - 112 (mEq/L)   CO2 17 (*) 19 - 32 (mEq/L)   Glucose, Bld 941 (*) 70 - 99 (mg/dL)   BUN 25 (*) 6 - 23 (mg/dL)   Creatinine, Ser 1.93 (*) 0.50 - 1.35 (mg/dL)   Calcium 10.2  8.4 - 10.5 (mg/dL)   Total Protein 8.4 (*) 6.0 - 8.3 (g/dL)   Albumin 4.6  3.5 - 5.2 (g/dL)   AST 11  0 - 37 (U/L)   ALT 14  0 - 53 (U/L)   Alkaline Phosphatase 93  39 - 117 (U/L)   Total Bilirubin 0.4  0.3 - 1.2 (mg/dL)   GFR calc non Af Amer 41 (*) >90 (mL/min)   GFR calc Af Amer 48 (*) >90 (mL/min)  URINE MICROSCOPIC-ADD ON      Component Value Range   Squamous Epithelial / LPF FEW (*) RARE    RBC / HPF 0-2  <3 (RBC/hpf)  GLUCOSE, CAPILLARY      Component Value Range   Glucose-Capillary >600 (*) 70 - 99 (mg/dL)   Ct Head Wo Contrast  02/22/2012  *RADIOLOGY REPORT*  Clinical Data: 41 year old male with headache, biparietal.  History of stroke 1 year ago with  residual right side weakness.  CT HEAD WITHOUT CONTRAST  Technique:  Contiguous axial images were obtained from the base of the skull through the vertex without contrast.  Comparison: Brain MRI 01/23/2011 and earlier.  Findings: Visualized paranasal sinuses and mastoids are clear.  No acute osseous abnormality identified.  Visualized orbits and scalp soft tissues are within normal limits.  Adenoid hypertrophy.  Sequelae of the left MCA territory deep gray matter and white matter infarcts seen on prior MRI.  Ex vacuo changes of the left lateral ventricle.  Hypodensity in the left basal ganglia. Wallerian degeneration evident at the left cerebral peduncle.  No midline shift, mass effect, or evidence of mass lesion.  No acute intracranial hemorrhage identified.  No suspicious intracranial vascular hyperdensity. No evidence of cortically based acute infarction identified.  IMPRESSION: 1. No acute intracranial abnormality. 2.  Expected evolution of 2012 left MCA deep gray and white matter infarcts.  Original Report Authenticated By: Randall An, M.D.      MDM  Discussed with patient and family that he needs to be admitted for DKA. Discusse of insulin and further evaluation. Spoke with Dr.Harwani who will come see the patient for admission since the patient has been given insulin drip until his 2 L of IV fluids. Patient voices understanding and is ready to be admitted.        Sheliah Mends, PA-C 03/10/12 1514

## 2012-03-10 NOTE — ED Notes (Signed)
Diet ordered per MD order.

## 2012-03-11 LAB — GLUCOSE, CAPILLARY
Glucose-Capillary: 185 mg/dL — ABNORMAL HIGH (ref 70–99)
Glucose-Capillary: 73 mg/dL (ref 70–99)
Glucose-Capillary: 85 mg/dL (ref 70–99)
Glucose-Capillary: 258 mg/dL — ABNORMAL HIGH (ref 70–99)
Glucose-Capillary: 167 mg/dL — ABNORMAL HIGH (ref 70–99)

## 2012-03-11 LAB — CBC
HCT: 33.4 % — ABNORMAL LOW (ref 39.0–52.0)
MCHC: 35.3 g/dL (ref 30.0–36.0)
MCV: 86.8 fL (ref 78.0–100.0)
RDW: 12.7 % (ref 11.5–15.5)

## 2012-03-11 LAB — COMPREHENSIVE METABOLIC PANEL
ALT: 11 U/L (ref 0–53)
AST: 16 U/L (ref 0–37)
Albumin: 3.6 g/dL (ref 3.5–5.2)
Alkaline Phosphatase: 66 U/L (ref 39–117)
Potassium: 2.8 mEq/L — ABNORMAL LOW (ref 3.5–5.1)
Sodium: 138 mEq/L (ref 135–145)
Total Protein: 6.6 g/dL (ref 6.0–8.3)

## 2012-03-11 LAB — CARDIAC PANEL(CRET KIN+CKTOT+MB+TROPI): Relative Index: 0.6 (ref 0.0–2.5)

## 2012-03-11 MED ORDER — INSULIN GLARGINE 100 UNIT/ML ~~LOC~~ SOLN
25.0000 [IU] | Freq: Two times a day (BID) | SUBCUTANEOUS | Status: DC
  Administered 2012-03-11 – 2012-03-13 (×5): 25 [IU] via SUBCUTANEOUS

## 2012-03-11 MED ORDER — LIVING WELL WITH DIABETES BOOK
Freq: Once | Status: AC
  Administered 2012-03-11: 18:00:00
  Filled 2012-03-11 (×2): qty 1

## 2012-03-11 MED ORDER — SODIUM CHLORIDE 0.45 % IV SOLN
INTRAVENOUS | Status: DC
  Administered 2012-03-11: 50 mL/h via INTRAVENOUS
  Administered 2012-03-12: 08:00:00 via INTRAVENOUS
  Administered 2012-03-12: 20 mL/h via INTRAVENOUS

## 2012-03-11 MED ORDER — POTASSIUM CHLORIDE CRYS ER 20 MEQ PO TBCR
EXTENDED_RELEASE_TABLET | ORAL | Status: AC
  Administered 2012-03-11: 40 meq via ORAL
  Filled 2012-03-11: qty 2

## 2012-03-11 MED ORDER — INSULIN GLARGINE 100 UNIT/ML ~~LOC~~ SOLN
30.0000 [IU] | SUBCUTANEOUS | Status: AC
  Administered 2012-03-11: 30 [IU] via SUBCUTANEOUS

## 2012-03-11 MED ORDER — POTASSIUM CHLORIDE CRYS ER 20 MEQ PO TBCR
40.0000 meq | EXTENDED_RELEASE_TABLET | Freq: Once | ORAL | Status: AC
  Administered 2012-03-11: 40 meq via ORAL

## 2012-03-11 MED ORDER — DOCUSATE SODIUM 100 MG PO CAPS
100.0000 mg | ORAL_CAPSULE | Freq: Every day | ORAL | Status: DC | PRN
  Administered 2012-03-11: 100 mg via ORAL
  Filled 2012-03-11 (×2): qty 1

## 2012-03-11 MED ORDER — POTASSIUM CHLORIDE CRYS ER 20 MEQ PO TBCR
40.0000 meq | EXTENDED_RELEASE_TABLET | Freq: Once | ORAL | Status: AC
  Administered 2012-03-11: 40 meq via ORAL
  Filled 2012-03-11: qty 2

## 2012-03-11 MED ORDER — BD GETTING STARTED TAKE HOME KIT: 1/2ML X 30G SYRINGES
1.0000 | Freq: Once | Status: AC
  Administered 2012-03-11: 1
  Filled 2012-03-11 (×2): qty 1

## 2012-03-11 MED ORDER — INSULIN ASPART 100 UNIT/ML ~~LOC~~ SOLN
0.0000 [IU] | Freq: Three times a day (TID) | SUBCUTANEOUS | Status: DC
  Administered 2012-03-11: 5 [IU] via SUBCUTANEOUS
  Administered 2012-03-11: 1 [IU] via SUBCUTANEOUS
  Administered 2012-03-12: 7 [IU] via SUBCUTANEOUS
  Administered 2012-03-12: 1 [IU] via SUBCUTANEOUS

## 2012-03-11 NOTE — Progress Notes (Signed)
Subjective:  Patient denies any chest pain or shortness of breath states feels better no further nausea or vomiting. Patient is off insulin drip Blood sugar is better controlled  Objective:  Vital Signs in the last 24 hours: Temp:  [98.6 F (37 C)-99.6 F (37.6 C)] 98.6 F (37 C) (04/22 1230) Pulse Rate:  [65-78] 74  (04/22 1230) Resp:  [17-25] 18  (04/22 1230) BP: (109-130)/(67-83) 124/74 mmHg (04/22 1230) SpO2:  [97 %-100 %] 100 % (04/22 1230) Weight:  [65.9 kg (145 lb 4.5 oz)] 65.9 kg (145 lb 4.5 oz) (04/21 1705)  Intake/Output from previous day: 04/21 0701 - 04/22 0700 In: 1650.4 [P.O.:240; I.V.:1410.4] Out: 350 [Urine:350] Intake/Output from this shift: Total I/O In: 150 [I.V.:150] Out: -   Physical Exam: Neck: no adenopathy, no carotid bruit, no JVD and supple, symmetrical, trachea midline Lungs: clear to auscultation bilaterally Heart: regular rate and rhythm, S1, S2 normal, no murmur, click, rub or gallop Abdomen: soft, non-tender; bowel sounds normal; no masses,  no organomegaly Extremities: extremities normal, atraumatic, no cyanosis or edema  Lab Results:  Basename 03/11/12 0400 03/10/12 1808  WBC 11.0* 13.1*  HGB 11.8* 12.5*  PLT 264 284    Basename 03/11/12 0400 03/10/12 1808  NA 138 137  K 2.8* 3.7  CL 104 98  CO2 22 22  GLUCOSE 66* 380*  BUN 17 21  CREATININE 1.41* 1.67*    Basename 03/11/12 0845 03/11/12 0013  TROPONINI <0.30 <0.30   Hepatic Function Panel  Basename 03/11/12 0400  PROT 6.6  ALBUMIN 3.6  AST 16  ALT 11  ALKPHOS 66  BILITOT 0.3  BILIDIR --  IBILI --    Basename 03/10/12 1617  CHOL 159   No results found for this basename: PROTIME in the last 72 hours  Imaging: Imaging results have been reviewed and Dg Chest Portable 1 View  03/10/2012  *RADIOLOGY REPORT*  Clinical Data: Hyperglycemia  PORTABLE CHEST - 1 VIEW  Comparison: None.  Findings: Cardiomediastinal silhouette is within normal limits. The lungs are clear. No  pleural effusion.  No pneumothorax.  No acute osseous abnormality.  IMPRESSION: Normal chest.  Original Report Authenticated By: Arline Asp, M.D.    Cardiac Studies:  Assessment/Plan:  Status post diabetic hyperosmolar nonketotic state Status post abdominal pain Hypertension History of left CVA with right paresis Hypercholesteremia Tobacco abuse History of marijuana abuse History of alcohol abuse Positive family history of CVA Hypokalemia Plan Replace K. Restart Lantus insulin per orders Check labs in a.m.  LOS: 1 day    Keith [name] N 03/11/2012, 1:11 PM

## 2012-03-11 NOTE — Progress Notes (Signed)
UR Completed. Simmons, Samyia Motter F [phone]  

## 2012-03-11 NOTE — Progress Notes (Addendum)
INITIAL ADULT NUTRITION ASSESSMENT Date: 03/11/2012   Time: 10:52 AM  Reason for Assessment: Consult for Diabetes diet education  ASSESSMENT: Male 41 y.o.  Dx: Diabetic hyperosmolar nonketotic state secondary to noncompliance to meds; Abdominal pain rule out cholelithiasis, rule out pancreatitis   Hx:  Past Medical History  Diagnosis Date  . Diabetes mellitus   . Hypertension   . Hypercholesteremia     Related Meds:  Scheduled Meds:   . sodium chloride  1,000 mL Intravenous Once   Followed by  . sodium chloride  1,000 mL Intravenous Once  . aspirin EC  325 mg Oral Daily  . heparin  5,000 Units Subcutaneous Q8H  . insulin aspart  0-9 Units Subcutaneous TID WC  . insulin glargine  25 Units Subcutaneous BID  . insulin glargine  30 Units Subcutaneous NOW  . metoprolol  50 mg Oral BID  . potassium chloride  40 mEq Oral Once  . potassium chloride  40 mEq Oral Once  . simvastatin  20 mg Oral q1800  . sodium chloride  3 mL Intravenous Q12H  . DISCONTD: insulin glargine  30 Units Subcutaneous QHS   Continuous Infusions:   . sodium chloride 50 mL/hr (03/11/12 0758)  . sodium chloride 1,000 mL (03/10/12 1900)  . DISCONTD: sodium chloride 125 mL/hr at 03/10/12 1800  . DISCONTD: insulin (NOVOLIN-R) infusion 4.4 Units/hr (03/10/12 1644)  . DISCONTD: insulin (NOVOLIN-R) infusion 0.2 Units/hr (03/11/12 0349)   PRN Meds:.alum & mag hydroxide-simeth   Ht: 5\' 7"  (170.2 cm)  Wt: 145 lb 4.5 oz (65.9 kg)  Ideal Wt: 67.3 kg % Ideal Wt: 98%  Usual Wt: 180 lb 1 year ago % Usual Wt: 81%  Body mass index is 22.75 kg/(m^2).  Food/Nutrition Related Hx: Declining intake over the past year; does not like certain foods that he used to like (ribs, pork chops).  Eats 2-3 meals and 1-2 snacks/day.  Labs:  CMP     Component Value Date/Time   NA 138 03/11/2012 0400   K 2.8* 03/11/2012 0400   CL 104 03/11/2012 0400   CO2 22 03/11/2012 0400   GLUCOSE 66* 03/11/2012 0400   BUN 17 03/11/2012  0400   CREATININE 1.41* 03/11/2012 0400   CALCIUM 9.2 03/11/2012 0400   PROT 6.6 03/11/2012 0400   ALBUMIN 3.6 03/11/2012 0400   AST 16 03/11/2012 0400   ALT 11 03/11/2012 0400   ALKPHOS 66 03/11/2012 0400   BILITOT 0.3 03/11/2012 0400   GFRNONAA 60* 03/11/2012 0400   GFRAA 70* 03/11/2012 0400    CBG (last 3)   Basename 03/11/12 0734 03/11/12 0446 03/11/12 0338  GLUCAP 258* 73 80    Lab Results  Component Value Date   HGBA1C 12.7* 03/10/2012    Intake/Output Summary (Last 24 hours) at 03/11/12 1057 Last data filed at 03/11/12 1000  Gross per 24 hour  Intake 1800.42 ml  Output    350 ml  Net 1450.42 ml     Diet Order: CHO modified low calorie diet  Supplements/Tube Feeding: None  IVF:    sodium chloride Last Rate: 50 mL/hr (03/11/12 0758)  sodium chloride Last Rate: 1,000 mL (03/10/12 1900)  DISCONTD: sodium chloride Last Rate: 125 mL/hr at 03/10/12 1800  DISCONTD: insulin (NOVOLIN-R) infusion Last Rate: 4.4 Units/hr (03/10/12 1644)  DISCONTD: insulin (NOVOLIN-R) infusion Last Rate: 0.2 Units/hr (03/11/12 0349)    Estimated Nutritional Needs:   Kcal: 1900-2100 Protein: 80-90 grams  Fluid: >2 liters  Patient's wife reports intake over the  past year has declined.  Patient does not like foods that he used to eat.  Wife also reports that poor intake and weight loss could partially be related to depression since patient is not able to do the things he used to do.  Doesn't get out of the house very often, just sits around and watches TV.    Discussed a healthy diet and some changes he can make to improve diet quality.    Patient is at nutrition risk given weight loss of 19% of usual weight over the past year.  NUTRITION DIAGNOSIS: -Inadequate oral intake (NI-2.1).  Status: Ongoing  RELATED TO: depression, appetite and taste changes  AS EVIDENCED BY: 19% weight loss over the past year  MONITORING/EVALUATION(Goals):  Goal:  Intake to meet >90% of nutrition needs to  prevent further weight loss and to improve glycemic control.  Monitor:  Labs, weight trend, PO intake.  EDUCATION NEEDS: -Education needs addressed; discussed CHO counting for diabetes, foods that contain CHO, portion sizes for CHO-containing foods.  INTERVENTION:  Inpatient diabetes diet education provided.  Recommend outpatient diabetes diet education--MD, please order.  Snacks TID between meals to help meet estimated nutrition needs.  Recommend changing diet to CHO high calorie to better meet nutrition needs.   Dietitian #:  [phone]  False Pass Per approved criteria  -Not Applicable    Keith [name] 03/11/2012, 10:52 AM

## 2012-03-11 NOTE — Progress Notes (Addendum)
Inpatient Diabetes Program Recommendations  AACE/ADA: New Consensus Statement on Inpatient Glycemic Control (2009)  Target Ranges:  Prepandial:   less than 140 mg/dL      Peak postprandial:   less than 180 mg/dL (1-2 hours)      Critically ill patients:  140 - 180 mg/dL   Reason for Visit: Note patient admitted with CBG's greater than 600 mg/dL.  Will likely need insulin at discharge.  Inpatient Diabetes Program Recommendations Insulin - Basal: May consider reducing Lantus to once daily.  Consider Lantus 25 units daily. HgbA1C: A1C=12.7% indicating suboptimal glycemic control.  Average CBG's are approximately 321 mg/dL.  Note: Will order insulin administration instruction by bedside RN.   Spoke to patient.  He states that he was supposed to be taking insulin prior to admit however had not been taking for more than a week.   Briefly discussed diabetes sick day rules with patient and the importance of frequent monitoring and taking insulin.  Will follow.

## 2012-03-12 LAB — BASIC METABOLIC PANEL
BUN: 10 mg/dL (ref 6–23)
GFR calc Af Amer: 84 mL/min — ABNORMAL LOW (ref >90)
GFR calc non Af Amer: 72 mL/min — ABNORMAL LOW (ref >90)
Potassium: 3.2 mEq/L — ABNORMAL LOW (ref 3.5–5.1)

## 2012-03-12 LAB — GLUCOSE, CAPILLARY
Glucose-Capillary: 316 mg/dL — ABNORMAL HIGH (ref 70–99)
Glucose-Capillary: 140 mg/dL — ABNORMAL HIGH (ref 70–99)
Glucose-Capillary: 186 mg/dL — ABNORMAL HIGH (ref 70–99)

## 2012-03-12 LAB — CBC
Hemoglobin: 10.7 g/dL — ABNORMAL LOW (ref 13.0–17.0)
MCHC: 35.7 g/dL (ref 30.0–36.0)
RDW: 12.7 % (ref 11.5–15.5)

## 2012-03-12 LAB — MAGNESIUM: Magnesium: 1.8 mg/dL (ref 1.5–2.5)

## 2012-03-12 LAB — HEMOGLOBIN A1C: Hgb A1c MFr Bld: 12.8 % — ABNORMAL HIGH (ref <5.7)

## 2012-03-12 LAB — OCCULT BLOOD X 1 CARD TO LAB, STOOL: Fecal Occult Bld: NEGATIVE

## 2012-03-12 MED ORDER — POTASSIUM CHLORIDE CRYS ER 20 MEQ PO TBCR
40.0000 meq | EXTENDED_RELEASE_TABLET | Freq: Once | ORAL | Status: AC
  Administered 2012-03-12: 40 meq via ORAL
  Filled 2012-03-12: qty 2

## 2012-03-12 NOTE — Progress Notes (Signed)
Subjective:  Patient denies any chest pain or shortness of breath. Blood sugar is better controlled. States had bright red blood per rectum earlier this morning but had normal bowel movement after that. Denies any dizziness lightheadedness  Objective:  Vital Signs in the last 24 hours: Temp:  [98.2 F (36.8 C)-99.2 F (37.3 C)] 98.2 F (36.8 C) (04/23 1115) Pulse Rate:  [62-74] 63  (04/23 1115) Resp:  [16-22] 22  (04/23 1600) BP: (122-154)/(77-99) 154/78 mmHg (04/23 1600) SpO2:  [97 %-100 %] 100 % (04/23 1600)  Intake/Output from previous day: 04/22 0701 - 04/23 0700 In: 1633 [P.O.:480; I.V.:1153] Out: 1000 [Urine:1000] Intake/Output from this shift: Total I/O In: -  Out: 675 [Urine:675]  Physical Exam: Neck: no adenopathy, no carotid bruit, no JVD and supple, symmetrical, trachea midline Lungs: clear to auscultation bilaterally Heart: regular rate and rhythm, S1, S2 normal, no murmur, click, rub or gallop Abdomen: soft, non-tender; bowel sounds normal; no masses,  no organomegaly Extremities: extremities normal, atraumatic, no cyanosis or edema  Lab Results:  Basename 03/12/12 0422 03/11/12 0400  WBC 8.3 11.0*  HGB 10.7* 11.8*  PLT 223 264    Basename 03/12/12 0422 03/11/12 0400  NA 137 138  K 3.2* 2.8*  CL 106 104  CO2 21 22  GLUCOSE 137* 66*  BUN 10 17  CREATININE 1.21 1.41*    Basename 03/11/12 0845 03/11/12 0013  TROPONINI <0.30 <0.30   Hepatic Function Panel  Basename 03/11/12 0400  PROT 6.6  ALBUMIN 3.6  AST 16  ALT 11  ALKPHOS 66  BILITOT 0.3  BILIDIR --  IBILI --    Basename 03/10/12 1617  CHOL 159   No results found for this basename: PROTIME in the last 72 hours  Imaging: Imaging results have been reviewed and Dg Chest Portable 1 View  03/10/2012  *RADIOLOGY REPORT*  Clinical Data: Hyperglycemia  PORTABLE CHEST - 1 VIEW  Comparison: None.  Findings: Cardiomediastinal silhouette is within normal limits. The lungs are clear. No pleural  effusion.  No pneumothorax.  No acute osseous abnormality.  IMPRESSION: Normal chest.  Original Report Authenticated By: Arline Asp, M.D.    Cardiac Studies:  Assessment/Plan:  Status post diabetic hyperosmolar nonketotic state  Status post abdominal pain  Hypertension  History of left CVA with right paresis  Hypercholesteremia  Tobacco abuse  History of marijuana abuse  History of alcohol abuse  Positive family history of CVA  Hypokalemia Status post mild rectal bleed probably secondary to hemorrhoids. Anemia secondary to hydration without significant GI loss Plan DC heparin subcutaneous Check stool for occult blood Check CBC be met in a.m. Replace K.  LOS: 2 days    Levent Kornegay N 03/12/2012, 6:48 PM

## 2012-03-13 LAB — CBC
MCH: 30.5 pg (ref 26.0–34.0)
MCHC: 35.7 g/dL (ref 30.0–36.0)
Platelets: 203 10*3/uL (ref 150–400)
RDW: 12.7 % (ref 11.5–15.5)

## 2012-03-13 LAB — BASIC METABOLIC PANEL
Calcium: 8.8 mg/dL (ref 8.4–10.5)
GFR calc non Af Amer: 72 mL/min — ABNORMAL LOW (ref >90)
Sodium: 138 mEq/L (ref 135–145)

## 2012-03-13 LAB — GLUCOSE, CAPILLARY: Glucose-Capillary: 85 mg/dL (ref 70–99)

## 2012-03-13 MED ORDER — ASPIRIN 325 MG PO TBEC: 325.0000 mg | DELAYED_RELEASE_TABLET | Freq: Every day | ORAL | Status: AC

## 2012-03-13 MED ORDER — INSULIN GLARGINE 100 UNIT/ML ~~LOC~~ SOLN
20.0000 [IU] | Freq: Two times a day (BID) | SUBCUTANEOUS | Status: DC
Start: 2012-03-13 — End: 2018-05-09

## 2012-03-13 NOTE — Discharge Summary (Signed)
  Discharge summary dictated on 03/13/2012 dictation number is [phone]

## 2012-03-13 NOTE — Discharge Summary (Signed)
[name], [name] NO.:  [id number]  MEDICAL RECORD NO.:  [id number]  LOCATION:  3307                         FACILITY:  [facility]  PHYSICIAN:  Allegra Lai. Terrence Dupont, M.D. DATE OF BIRTH:  08-23-1971  DATE OF ADMISSION:  03/10/2012 DATE OF DISCHARGE:                              DISCHARGE SUMMARY   ADMITTING DIAGNOSIS: 1. Diabetic hyperosmolar nonketotic state.  2.  Abdominal pain, rule     out cholelithiasis, rule out pancreatitis. 2. Hypertension. 3. Hypercholesteremia. 4. Tobacco abuse. 5. History of alcohol abuse. 6. History of marijuana abuse. 7. History of cerebrovascular accident in the past, positive family     history of cerebrovascular accident.  DISCHARGE DIAGNOSES: 1. Status post diabetic hyperosmolar nonketotic state secondary to     noncompliance to the medication. 2. Status post abdominal pain. 3. Hypertension. 4. History of left cerebrovascular accident with right paresis in the     past. 5. Hypercholesteremia. 6. Tobacco abuse. 7. History of marijuana abuse. 8. History of alcohol abuse. 9. Positive family history of cerebrovascular accident. 10.Resolving hypokalemia. 11.Status post mild rectal bleed secondary to probably due to     hemorrhoids. 12.Anemia secondary to hydration doubt significant gastrointestinal     loss.  Hemoglobin stable.  DISCHARGED HOME MEDICATIONS: 1. Enteric-coated aspirin 325 mg 1 tablet daily. 2. Lantus insulin 20 units twice daily. 3. Amlodipine 5 mg 1 tablet daily. 4. Lisinopril 2.5 mg 1 tablet daily. 5. Metoprolol 50 mg 1 tablet twice daily. 6. Pravastatin 40 mg 1 tablet daily.  The patient has been advised to stop clonidine, glipizide, and metformin.  The patient has been advised to monitor blood sugar 3 times daily and chart.  Follow up with Dr. Doylene Canard in 1 week.  The patient will be followed up by GI as outpatient for rectal bleeding.  CONDITION ON DISCHARGE:  Stable.  Follow up with Dr. Doylene Canard in 1  week.  BRIEF HISTORY AND HOSPITAL COURSE:  Keith [name] is a 41 year old male with past medical history significant for left CVA with right paresis, hypertension, insulin-requiring diabetes mellitus, tobacco abuse, history of marijuana abuse, history of alcohol abuse, positive family history of stroke and diabetes.  He came to the ER complaining of vague abdominal pain associated with nausea, vomiting, and no appetite for last few days.  The patient states he has stopped his insulin and diabetic medication and was noted to have blood sugar above 900 in ER. The patient denies any fever or chills.  Denies any urinary complaints. Denies chest pain or shortness of breath.  Denies palpitation, lightheadedness, or syncope.  Denies PND, orthopnea, or leg swelling. Denies any further weakness in the arms or slurred speech.  The patient occasionally had blurred vision which he attributes to fluctuating blood sugar.  The patient received IV normal saline 2 L and was started on insulin drip with improvement in his symptoms.  PAST MEDICAL HISTORY:  As above.  EXAMINATION:  VITAL SIGNS: His blood pressure was 123/89, pulse was 54. GENERAL: He was afebrile. EYES: Conjunctivae are pink. NECK: Supple.  No JVD.  No bruit. LUNGS: Clear to auscultation without rhonchi or rales. CARDIOVASCULAR: S1, S2 were normal.  There  was no murmur, gallop, or rub. ABDOMEN: Soft.  Bowel sounds are present.  Nontender. EXTREMITIES: There is no clubbing, cyanosis, or edema. NEURO: He was alert, awake, oriented x3.  He had minimal residual right arm weakness.  LABS:  His admission labs:  Sodium was 125 potassium 6.4, BUN 25, creatinine 1.93, glucose was 941.  Repeat electrolytes; sodium 136, potassium 4.5, BUN 23, creatinine 1.71, which is trending down, glucose was 575.  Repeat electrolytes: Sodium 137, potassium 3.7, BUN 21, creatinine 1.67, glucose was 380.  Today, his labs:  Sodium 138, potassium 3.1, which has  been replaced, BUN 11, creatinine 1.22.  His blood sugar this morning was 83, hemoglobin is 10.6, hematocrit 29.7, white count of 8.3.  His 2 sets of troponin I were negative.  CK was 394, MB 2.2.  Second set: CK 429, MB 2.3, which were essentially negative.  Fecal occult blood was negative.  His CBGs have been running between 70-250 in the last 2 days.  BRIEF HOSPITAL COURSE:  The patient was admitted to step-down unit.  The patient was started on IV insulin and received 2 L bolus of IV fluids in the ER with improvement in his renal function and control of blood sugars.  IV fluids were switched to D5 half-normal saline, and subsequently has been cut down to [facility].  The patient was restarted on Lantus insulin 25 units twice daily.  The patient had occasional episode of hypoglycemia and blood sugar in the range of 70-80, and Lantus insulin dose will be reduced to 20 units twice daily.  The patient has been advised to monitor blood sugar 3 times daily, and will be followed up closely by Dr. Doylene Canard.  The patient is ambulating in the hall without any problems.  The patient had brief episode of bright red blood per rectum.  The stool was sent for occult blood which were negative. His hemoglobin has been stable.  The patient will be discharged home on above medications and will be followed up by Dr. Doylene Canard in 1 week.     Allegra Lai. Terrence Dupont, M.D.     MNH/MEDQ  D:  03/13/2012  T:  03/13/2012  Job:  [id number]

## 2012-03-13 NOTE — Progress Notes (Signed)
Discharge instructions given, forms signed and copies given to patient along with scripts. VSS.  IV removed. CBG at discharge 107

## 2012-03-13 NOTE — Discharge Instructions (Signed)
Diabetes and Exercise Regular exercise is important and can help:   Control blood glucose (sugar).   Decrease blood pressure.    Control blood lipids (cholesterol, triglycerides).   Improve overall health.  BENEFITS FROM EXERCISE  Improved fitness.   Improved flexibility.   Improved endurance.   Increased bone density.   Weight control.   Increased muscle strength.   Decreased body fat.   Improvement of the body's use of insulin, a hormone.   Increased insulin sensitivity.   Reduction of insulin needs.   Reduced stress and tension.   Helps you feel better.  People with diabetes who add exercise to their lifestyle gain additional benefits, including:  Weight loss.   Reduced appetite.   Improvement of the body's use of blood glucose.   Decreased risk factors for heart disease:   Lowering of cholesterol and triglycerides.   Raising the level of good cholesterol (high-density lipoproteins, HDL).   Lowering blood sugar.   Decreased blood pressure.  TYPE 1 DIABETES AND EXERCISE  Exercise will usually lower your blood glucose.   If blood glucose is greater than 240 mg/dl, check urine ketones. If ketones are present, do not exercise.   Location of the insulin injection sites may need to be adjusted with exercise. Avoid injecting insulin into areas of the body that will be exercised. For example, avoid injecting insulin into:   The arms when playing tennis.   The legs when jogging. For more information, discuss this with your caregiver.   Keep a record of:   Food intake.   Type and amount of exercise.   Expected peak times of insulin action.   Blood glucose levels.  Do this before, during, and after exercise. Review your records with your caregiver. This will help you to develop guidelines for adjusting food intake and insulin amounts.  TYPE 2 DIABETES AND EXERCISE  Regular physical activity can help control blood glucose.   Exercise is important  because it may:   Increase the body's sensitivity to insulin.   Improve blood glucose control.   Exercise reduces the risk of heart disease. It decreases serum cholesterol and triglycerides. It also lowers blood pressure.   Those who take insulin or oral hypoglycemic agents should watch for signs of hypoglycemia. These signs include dizziness, shaking, sweating, chills, and confusion.   Body water is lost during exercise. It must be replaced. This will help to avoid loss of body fluids (dehydration) or heat stroke.  Be sure to talk to your caregiver before starting an exercise program to make sure it is safe for you. Remember, any activity is better than none.  Document Released: 01/27/2004 Document Revised: 10/26/2011 Document Reviewed: 05/13/2009 [facility] Patient Information 2012 [place].Diabetes tipo 2 (Type 2 Diabetes)  La diabetes es una enfermedad de larga evolucin (crnica). En la diabetes tipo 2 pueden ocurrir [employer], o ambas:   El pncreas no produce la cantidad suficiente de una hormona llamada insulina.   El cuerpo tiene problemas para Risk manager la insulina que produce.  CUIDADOS EN EL HOGAR  Controle el nivel de azcar en la sangre (glucosa)una vez por da, o segn lo que le indique el mdico.   Tome los medicamentos segn le haya indicado el mdico.   No fume.   Consuma una dieta saludable. La prdida de peso puede mejorar la diabetes.   Sepa qu es el nivel bajo de glucosa en la sangre (hipoglucemia). Conozca cmo tratarla.   Haga revisar sus ojos  regularmente.   Concurra para un examen fsico anual. Controle su presin arterial. Hgase anlisis de sangre y de [place].   Lleve un brazalete que indique que tiene diabetes.   Controle sus pies todas las noches para observar si hay cortes, llagas, ampollas o enrojecimiento. Comunquese con su mdico si:  SOLICITE AYUDA DE INMEDIATO SI:  Tiene problemas para mantener el nivel de glucosa en  el rango indicado.   Tiene problemas con los medicamentos.   Est enfermo y no mejora luego de 24 horas.   Tiene una lcera en la piel o una herida que no se cura.   Tiene problemas o cambios en la visin.   Tiene fiebre.  ASEGRESE DE QUE:  Comprende estas instrucciones.   Controlar su enfermedad.   Solicitar ayuda de inmediato si no mejora o empeora.  Document Released: 02/02/2009 Document Revised: 10/26/2011 [facility] Patient Information 2012 [place].Diabetes and Exercise Regular exercise is important and can help:   Control blood glucose (sugar).   Decrease blood pressure.    Control blood lipids (cholesterol, triglycerides).   Improve overall health.  BENEFITS FROM EXERCISE  Improved fitness.   Improved flexibility.   Improved endurance.   Increased bone density.   Weight control.   Increased muscle strength.   Decreased body fat.   Improvement of the body's use of insulin, a hormone.   Increased insulin sensitivity.   Reduction of insulin needs.   Reduced stress and tension.   Helps you feel better.  People with diabetes who add exercise to their lifestyle gain additional benefits, including:  Weight loss.   Reduced appetite.   Improvement of the body's use of blood glucose.   Decreased risk factors for heart disease:   Lowering of cholesterol and triglycerides.   Raising the level of good cholesterol (high-density lipoproteins, HDL).   Lowering blood sugar.   Decreased blood pressure.  TYPE 1 DIABETES AND EXERCISE  Exercise will usually lower your blood glucose.   If blood glucose is greater than 240 mg/dl, check urine ketones. If ketones are present, do not exercise.   Location of the insulin injection sites may need to be adjusted with exercise. Avoid injecting insulin into areas of the body that will be exercised. For example, avoid injecting insulin into:   The arms when playing tennis.   The legs when jogging. For  more information, discuss this with your caregiver.   Keep a record of:   Food intake.   Type and amount of exercise.   Expected peak times of insulin action.   Blood glucose levels.  Do this before, during, and after exercise. Review your records with your caregiver. This will help you to develop guidelines for adjusting food intake and insulin amounts.  TYPE 2 DIABETES AND EXERCISE  Regular physical activity can help control blood glucose.   Exercise is important because it may:   Increase the body's sensitivity to insulin.   Improve blood glucose control.   Exercise reduces the risk of heart disease. It decreases serum cholesterol and triglycerides. It also lowers blood pressure.   Those who take insulin or oral hypoglycemic agents should watch for signs of hypoglycemia. These signs include dizziness, shaking, sweating, chills, and confusion.   Body water is lost during exercise. It must be replaced. This will help to avoid loss of body fluids (dehydration) or heat stroke.  Be sure to talk to your caregiver before starting an exercise program to make sure it is safe for you. Remember, any  activity is better than none.  Document Released: 01/27/2004 Document Revised: 10/26/2011 Document Reviewed: 05/13/2009 [facility] Patient Information 2012 [place].

## 2012-03-24 ENCOUNTER — Emergency Department (HOSPITAL_COMMUNITY)
Admission: EM | Admit: 2012-03-24 | Discharge: 2012-03-24 | Disposition: A | Discharge status: Other Acute Inpatient Hospital | Payer: Medicaid Other | Source: Home / Self Care | Attending: Emergency Medicine | Admitting: Emergency Medicine

## 2012-07-22 ENCOUNTER — Other Ambulatory Visit: Payer: Self-pay | Admitting: Cardiology

## 2013-09-01 ENCOUNTER — Encounter (HOSPITAL_COMMUNITY): Payer: Self-pay | Admitting: Emergency Medicine

## 2013-09-01 ENCOUNTER — Emergency Department (HOSPITAL_COMMUNITY)
Admission: EM | Admit: 2013-09-01 | Discharge: 2013-09-01 | Disposition: A | Discharge status: Home / Self Care | Payer: Medicaid Other | Source: Home / Self Care | Attending: Emergency Medicine | Admitting: Emergency Medicine

## 2013-09-01 ENCOUNTER — Other Ambulatory Visit (HOSPITAL_COMMUNITY)
Admission: RE | Admit: 2013-09-01 | Discharge: 2013-09-01 | Disposition: A | Discharge status: Home / Self Care | Payer: Medicaid Other | Source: Ambulatory Visit | Attending: Emergency Medicine | Admitting: Emergency Medicine

## 2013-09-01 DIAGNOSIS — Z113 Encounter for screening for infections with a predominantly sexual mode of transmission: Secondary | ICD-10-CM | POA: Insufficient documentation

## 2013-09-01 DIAGNOSIS — N342 Other urethritis: Secondary | ICD-10-CM

## 2013-09-01 HISTORY — DX: Cerebral infarction, unspecified: I63.9

## 2013-09-01 LAB — HIV ANTIBODY (ROUTINE TESTING W REFLEX): HIV: NONREACTIVE

## 2013-09-01 MED ORDER — CEFTRIAXONE SODIUM 250 MG IJ SOLR
250.0000 mg | Freq: Once | INTRAMUSCULAR | Status: AC
  Administered 2013-09-01: 250 mg via INTRAMUSCULAR

## 2013-09-01 MED ORDER — AZITHROMYCIN 250 MG PO TABS
1000.0000 mg | ORAL_TABLET | Freq: Every day | ORAL | Status: DC
  Administered 2013-09-01: 1000 mg via ORAL

## 2013-09-01 MED ORDER — CEFTRIAXONE SODIUM 250 MG IJ SOLR
INTRAMUSCULAR | Status: AC
  Filled 2013-09-01: qty 250

## 2013-09-01 MED ORDER — AZITHROMYCIN 250 MG PO TABS
ORAL_TABLET | ORAL | Status: AC
  Filled 2013-09-01: qty 4

## 2013-09-01 NOTE — ED Notes (Signed)
Pt   Reports  Symptoms  Of  A  Penile  Discharge           For  sev  Weeks         denys  Any  Sores     -    denys  Any other   Symptoms

## 2013-09-01 NOTE — ED Provider Notes (Signed)
Medical screening examination/treatment/procedure(s) were performed by non-physician practitioner and as supervising physician I was immediately available for consultation/collaboration.  Philipp Deputy, M.D.  Harden Mo, MD 09/01/13 [phone]

## 2013-09-01 NOTE — ED Provider Notes (Signed)
CSN: [id number]     Arrival date & time 09/01/13  1201 History   None    Chief Complaint  Patient presents with  . SEXUALLY TRANSMITTED DISEASE   (Consider location/radiation/quality/duration/timing/severity/associated sxs/prior Treatment) HPI Comments: Last time had similar sx was 4-5 months ago, received "shot and some pills" and discharge went away. Was not told of diagnosis. Has not been tested for Hiv or syphilis.    Patient is a 42 y.o. male presenting with penile discharge. The history is provided by the patient.  Penile Discharge This is a new problem. Episode onset: 2 weeks ago. The problem occurs constantly. The problem has not changed since onset.Pertinent negatives include no abdominal pain. Nothing aggravates the symptoms. Nothing relieves the symptoms. He has tried nothing for the symptoms.    Past Medical History  Diagnosis Date  . Diabetes mellitus   . Hypertension   . Hypercholesteremia   . Stroke    History reviewed. No pertinent past surgical history. History reviewed. No pertinent family history. History  Substance Use Topics  . Smoking status: Current Every Day Smoker    Types: Cigarettes  . Smokeless tobacco: Not on file  . Alcohol Use: No    Review of Systems  Constitutional: Negative for fever and chills.  Gastrointestinal: Negative for nausea, vomiting and abdominal pain.  Genitourinary: Positive for discharge. Negative for dysuria, hematuria, flank pain, genital sores and penile pain.    Allergies  Review of patient's allergies indicates no known allergies.  Home Medications   Current Outpatient Rx  Name  Route  Sig  Dispense  Refill  . amLODipine (NORVASC) 5 MG tablet   Oral   Take 5 mg by mouth daily.         . insulin glargine (LANTUS) 100 UNIT/ML injection   Subcutaneous   Inject 20 Units into the skin 2 (two) times daily.   10 mL   3   . lisinopril (PRINIVIL,ZESTRIL) 2.5 MG tablet   Oral   Take 2.5 mg by mouth every morning.         . metoprolol (LOPRESSOR) 50 MG tablet   Oral   Take 50 mg by mouth 2 (two) times daily.         . pravastatin (PRAVACHOL) 40 MG tablet   Oral   Take 40 mg by mouth at bedtime.          BP 149/94  Pulse 57  Temp(Src) 98.2 F (36.8 C) (Oral)  Resp 16  SpO2 100% Physical Exam  Constitutional: He appears well-developed and well-nourished. No distress.  Pulmonary/Chest: Effort normal.  Genitourinary:  GU exam deferred as pt denies any sx other than penile discharge.     ED Course  Procedures (including critical care time) Labs Review Labs Reviewed  HIV ANTIBODY (ROUTINE TESTING)  RPR  URINE CYTOLOGY ANCILLARY ONLY   Imaging Review No results found.  EKG Interpretation     Ventricular Rate:    PR Interval:    QRS Duration:   QT Interval:    QTC Calculation:   R Axis:     Text Interpretation:              MDM   1. Urethritis    Most likely gc or chlamydia. Pt understands his sx are likely STD and he is getting them by having unprotected sex.  HIV and RPR drawn. Urine sent for cytology. Empirically tx with rocephin 250mg  IM and zithromax 1000mg  po.     Dionne Bucy  Jonah Blue, NP 09/01/13 1417

## 2013-09-03 ENCOUNTER — Telehealth (HOSPITAL_COMMUNITY): Payer: Self-pay | Admitting: *Deleted

## 2013-09-03 ENCOUNTER — Telehealth (HOSPITAL_COMMUNITY): Payer: Self-pay | Admitting: Emergency Medicine

## 2013-09-03 MED ORDER — METRONIDAZOLE 500 MG PO TABS: ORAL_TABLET | ORAL | Status: DC

## 2013-09-03 NOTE — ED Notes (Signed)
GC/Chlamydia neg., Trich pos., HIV/RPR non-reactive.  10/14 Message sent to Dr. Jake Michaelis  10/15 He e-prescribed Flagyl to [facility] on [address]. I called pt. Pt. verified x 2 and given results.  Pt.  Pt. instructed to no alcohol for 48 hrs after taking this medication.  Pt. instructed to notify his partner to be treated with Flagyl, no sex until you have finished your medication and your partner has been treated and to practice safe sex. Pt. Instructed to get HIV rechecked in 6 mos. at the [facility] STD clinic, by appointment. Roselyn Meier 09/03/2013

## 2013-09-03 NOTE — ED Notes (Signed)
DNA probe came back positive for Trichomonas. Will treat with Flagyl 500 mg, 2 tablets twice a day for one day.  Harden Mo, MD 09/03/13 [phone]

## 2013-09-03 NOTE — Telephone Encounter (Signed)
Message copied by Harden Mo on Wed Sep 03, 2013  8:03 AM ------      Message from: Roselyn Meier      Created: Tue Sep 02, 2013  7:03 PM      Regarding: labs       Trich pos.  Needs tx. Pt. of Willeen Cass NP- you co-signed.      Roselyn Meier      09/02/2013       ------

## 2014-12-21 ENCOUNTER — Emergency Department (HOSPITAL_COMMUNITY)
Admission: EM | Admit: 2014-12-21 | Discharge: 2014-12-21 | Disposition: A | Discharge status: Home / Self Care | Payer: Medicaid Other | Source: Home / Self Care | Attending: Family Medicine | Admitting: Family Medicine

## 2014-12-21 ENCOUNTER — Other Ambulatory Visit (HOSPITAL_COMMUNITY)
Admission: RE | Admit: 2014-12-21 | Discharge: 2014-12-21 | Disposition: A | Discharge status: Home / Self Care | Payer: Medicaid Other | Source: Ambulatory Visit | Attending: Family Medicine | Admitting: Family Medicine

## 2014-12-21 ENCOUNTER — Encounter (HOSPITAL_COMMUNITY): Payer: Self-pay | Admitting: Emergency Medicine

## 2014-12-21 DIAGNOSIS — N342 Other urethritis: Secondary | ICD-10-CM

## 2014-12-21 DIAGNOSIS — Z113 Encounter for screening for infections with a predominantly sexual mode of transmission: Secondary | ICD-10-CM | POA: Diagnosis present

## 2014-12-21 MED ORDER — CEFTRIAXONE SODIUM 250 MG IJ SOLR
INTRAMUSCULAR | Status: AC
  Filled 2014-12-21: qty 250

## 2014-12-21 MED ORDER — LIDOCAINE HCL (PF) 1 % IJ SOLN
INTRAMUSCULAR | Status: AC
  Filled 2014-12-21: qty 5

## 2014-12-21 MED ORDER — AZITHROMYCIN 250 MG PO TABS
1000.0000 mg | ORAL_TABLET | Freq: Once | ORAL | Status: AC
  Administered 2014-12-21: 1000 mg via ORAL

## 2014-12-21 MED ORDER — CEFTRIAXONE SODIUM 250 MG IJ SOLR
250.0000 mg | Freq: Once | INTRAMUSCULAR | Status: AC
  Administered 2014-12-21: 250 mg via INTRAMUSCULAR

## 2014-12-21 MED ORDER — AZITHROMYCIN 250 MG PO TABS
ORAL_TABLET | ORAL | Status: AC
  Filled 2014-12-21: qty 4

## 2014-12-21 NOTE — Discharge Instructions (Signed)
If lab results indicate you need further treatment, you will be notified by phone You have been treated at [facility] for gonorrhea and chlamydia with azithromycin 1000mg   and ceftriaxone 250mg . No sex x 2 weeks Use condoms Follow up at [facility] or with PCP if symptoms do not improve and for repeat HIV testing in 2-3 mos. Urethritis Urethritis is an inflammation of the tube through which urine exits your bladder (urethra).  CAUSES Urethritis is often caused by an infection in your urethra. The infection can be viral, like herpes. The infection can also be bacterial, like gonorrhea. RISK FACTORS Risk factors of urethritis include:  Having sex without using a condom.  Having multiple sexual partners.  Having poor hygiene. SIGNS AND SYMPTOMS Symptoms of urethritis are less noticeable in women than in men. These symptoms include:  Burning feeling when you urinate (dysuria).  Discharge from your urethra.  Blood in your urine (hematuria).  Urinating more than usual. DIAGNOSIS  To confirm a diagnosis of urethritis, your health care provider will do the following:  Ask about your sexual history.  Perform a physical exam.  Have you provide a sample of your urine for lab testing.  Use a cotton swab to gently collect a sample from your urethra for lab testing. TREATMENT  It is important to treat urethritis. Depending on the cause, untreated urethritis may lead to serious genital infections and possibly infertility. Urethritis caused by a bacterial infection is treated with antibiotic medicine. All sexual partners must be treated.  HOME CARE INSTRUCTIONS  Do not have sex until the test results are known and treatment is completed, even if your symptoms go away before you finish treatment.  If you were prescribed an antibiotic, finish it all even if you start to feel better. SEEK MEDICAL CARE IF:   Your symptoms are not improved in 3 days.  Your symptoms are getting worse.  You develop  abdominal pain or pelvic pain (in women).  You develop joint pain.  You have a fever. SEEK IMMEDIATE MEDICAL CARE IF:   You have severe pain in the belly, back, or side.  You have repeated vomiting. MAKE SURE YOU:  Understand these instructions.  Will watch your condition.  Will get help right away if you are not doing well or get worse. Document Released: 05/02/2001 Document Revised: 03/23/2014 Document Reviewed: 07/07/2013 [facility] Patient Information 2015 [place], [place]. This information is not intended to replace advice given to you by your health care provider. Make sure you discuss any questions you have with your health care provider.

## 2014-12-21 NOTE — ED Notes (Signed)
Pt states that his partner told him she was exposed to a STD. Pt states that he has had a cloudy discharge.

## 2014-12-21 NOTE — ED Provider Notes (Addendum)
CSN: [id number]     Arrival date & time 12/21/14  1255 History   First MD Initiated Contact with Patient 12/21/14 1429     Chief Complaint  Patient presents with  . Exposure to STD   (Consider location/radiation/quality/duration/timing/severity/associated sxs/prior Treatment) HPI Comments: Was informed by partner that he should have STI testing. Partner tested +for trichomonas. Patient developed penile discharge one week ago.   Patient is a 44 y.o. male presenting with STD exposure. The history is provided by the patient.  Exposure to STD This is a new problem. Episode onset: 1 week ago. The problem occurs constantly.    Past Medical History  Diagnosis Date  . Diabetes mellitus   . Hypertension   . Hypercholesteremia   . Stroke    History reviewed. No pertinent past surgical history. History reviewed. No pertinent family history. History  Substance Use Topics  . Smoking status: Current Every Day Smoker -- 0.50 packs/day    Types: Cigarettes, Cigars  . Smokeless tobacco: Not on file  . Alcohol Use: Yes    Review of Systems  Constitutional: Negative for fever and fatigue.  Genitourinary: Positive for discharge. Negative for dysuria, urgency, frequency, hematuria, flank pain, decreased urine volume, penile swelling, scrotal swelling, difficulty urinating, genital sores, penile pain and testicular pain.  Musculoskeletal: Negative for back pain.  Skin: Negative.   All other systems reviewed and are negative.   Allergies  Review of patient's allergies indicates no known allergies.  Home Medications   Prior to Admission medications   Medication Sig Start Date End Date Taking? Authorizing Provider  amLODipine (NORVASC) 5 MG tablet Take 5 mg by mouth daily.    Historical Provider, MD  insulin glargine (LANTUS) 100 UNIT/ML injection Inject 20 Units into the skin 2 (two) times daily. 03/13/12   Clent Demark, MD  lisinopril (PRINIVIL,ZESTRIL) 2.5 MG tablet Take 2.5 mg by mouth  every morning.    Historical Provider, MD  metoprolol (LOPRESSOR) 50 MG tablet Take 50 mg by mouth 2 (two) times daily.    Historical Provider, MD  metroNIDAZOLE (FLAGYL) 500 MG tablet Take 2 tablets twice a day for one day. 09/03/13   Harden Mo, MD  pravastatin (PRAVACHOL) 40 MG tablet Take 40 mg by mouth at bedtime.    Historical Provider, MD   BP 145/78 mmHg  Pulse 55  Temp(Src) 98.1 F (36.7 C) (Oral)  Resp 18  Ht 5\' 7"  (1.702 m)  Wt 158 lb (71.668 kg)  BMI 24.74 kg/m2  SpO2 98% Physical Exam  Constitutional: He is oriented to person, place, and time. He appears well-developed and well-nourished. No distress.  HENT:  Head: Normocephalic and atraumatic.  Eyes: Conjunctivae are normal.  Cardiovascular: Normal rate, regular rhythm and normal heart sounds.   Pulmonary/Chest: Effort normal and breath sounds normal.  Abdominal: Soft. Bowel sounds are normal. He exhibits no distension. There is no tenderness.  Genitourinary:  Patient declined this portion of exam  Musculoskeletal: Normal range of motion.  Neurological: He is alert and oriented to person, place, and time.  Skin: Skin is warm and dry. No rash noted. No erythema.  Psychiatric: He has a normal mood and affect. His behavior is normal.  Nursing note and vitals reviewed.   ED Course  Procedures (including critical care time) Labs Review Labs Reviewed  RPR  HIV ANTIBODY (ROUTINE TESTING)  URINE CYTOLOGY ANCILLARY ONLY    Imaging Review No results found.   MDM   1. Urethritis  STI testing performed. Patient treated empirically at [facility] for gonorrhea and chlamydia with azithromycin 1000mg  po and ceftriaxone 250mg  IM. No sex x 2 weeks Use condoms Follow up at [facility] or with PCP if symptoms do not improve and for repeat HIV testing in 2-3 mos.     Lutricia Feil, [place] 12/21/14 1504  12/23/2014 Addendum: Patent's urine cytology +for trichomonas. Contacted patient at phone number listed on chart and  left general voice mail asking him tot contact [place] so that we may inform him of his results. Electronic prescription sent to patient's pharmacy of record for 7 day course of flagyl to be taken as directed. Message also sent to Renne Musca, RN asking that she try to contact patient to notify of results.   Lutricia Feil, [place] 12/23/14 0830  12/24/2014 Addendum: I was able to reach this gentleman by phone this morning. We reviewed his results and he stated he would go to pharmacy to pick up his prescription and begin taking today.   [employer] [place], [place] 12/24/14 [phone]

## 2014-12-22 LAB — URINE CYTOLOGY ANCILLARY ONLY
CHLAMYDIA, DNA PROBE: NEGATIVE
NEISSERIA GONORRHEA: NEGATIVE
TRICH (WINDOWPATH): POSITIVE — AB

## 2014-12-23 ENCOUNTER — Telehealth (HOSPITAL_COMMUNITY): Payer: Self-pay | Admitting: *Deleted

## 2014-12-23 MED ORDER — METRONIDAZOLE 500 MG PO TABS: 500.0000 mg | ORAL_TABLET | Freq: Two times a day (BID) | ORAL | Status: DC

## 2014-12-23 NOTE — ED Notes (Addendum)
GC/Chlamydia neg., Trich pos.  2/2 Message sent to [facility] PA.  2/3 She e-prescribed Flagyl and tried to call pt. twice today- left message. Call 1. 12/23/2014 Dot Been PA was able to contact pt. today. She notified pt. of results and where to pick up his Rx. 12/24/2014

## 2015-03-10 ENCOUNTER — Other Ambulatory Visit (HOSPITAL_COMMUNITY): Payer: Self-pay | Admitting: Cardiovascular Disease

## 2015-03-10 DIAGNOSIS — G8190 Hemiplegia, unspecified affecting unspecified side: Secondary | ICD-10-CM

## 2015-03-17 ENCOUNTER — Other Ambulatory Visit: Payer: Self-pay

## 2015-03-17 ENCOUNTER — Ambulatory Visit (HOSPITAL_COMMUNITY)
Admission: RE | Admit: 2015-03-17 | Discharge: 2015-03-17 | Disposition: A | Discharge status: Home / Self Care | Payer: Medicaid Other | Source: Ambulatory Visit | Attending: Cardiovascular Disease | Admitting: Cardiovascular Disease

## 2015-03-17 ENCOUNTER — Encounter (HOSPITAL_COMMUNITY)
Admission: RE | Admit: 2015-03-17 | Discharge: 2015-03-17 | Disposition: A | Discharge status: Home / Self Care | Payer: Medicaid Other | Source: Ambulatory Visit | Attending: Cardiovascular Disease | Admitting: Cardiovascular Disease

## 2015-03-17 DIAGNOSIS — E119 Type 2 diabetes mellitus without complications: Secondary | ICD-10-CM | POA: Diagnosis not present

## 2015-03-17 DIAGNOSIS — I2510 Atherosclerotic heart disease of native coronary artery without angina pectoris: Secondary | ICD-10-CM | POA: Insufficient documentation

## 2015-03-17 DIAGNOSIS — I10 Essential (primary) hypertension: Secondary | ICD-10-CM | POA: Insufficient documentation

## 2015-03-17 DIAGNOSIS — Z8673 Personal history of transient ischemic attack (TIA), and cerebral infarction without residual deficits: Secondary | ICD-10-CM | POA: Diagnosis not present

## 2015-03-17 DIAGNOSIS — G8190 Hemiplegia, unspecified affecting unspecified side: Secondary | ICD-10-CM | POA: Diagnosis not present

## 2015-03-17 MED ORDER — REGADENOSON 0.4 MG/5ML IV SOLN
0.4000 mg | Freq: Once | INTRAVENOUS | Status: AC
  Administered 2015-03-17: 0.4 mg via INTRAVENOUS

## 2015-03-17 MED ORDER — TECHNETIUM TC 99M SESTAMIBI GENERIC - CARDIOLITE
30.0000 | Freq: Once | INTRAVENOUS | Status: AC | PRN
  Administered 2015-03-17: 30 via INTRAVENOUS

## 2015-03-17 MED ORDER — REGADENOSON 0.4 MG/5ML IV SOLN
INTRAVENOUS | Status: AC
  Filled 2015-03-17: qty 5

## 2015-03-17 MED ORDER — TECHNETIUM TC 99M SESTAMIBI GENERIC - CARDIOLITE
10.0000 | Freq: Once | INTRAVENOUS | Status: AC | PRN
  Administered 2015-03-17: 10 via INTRAVENOUS

## 2015-03-17 NOTE — Progress Notes (Signed)
Pt w/ N/V following Lexiscan at about w/ Dr. Doylene Canard at bedside.  Given basin and cool wet cloths. Pt denied wanting anti-nausea meds at this time. Sitting up on stretcher, denied any chest pain w/ test.

## 2015-06-17 ENCOUNTER — Other Ambulatory Visit: Payer: Self-pay | Admitting: General Surgery

## 2015-06-18 NOTE — Progress Notes (Signed)
Several attempts were made to reach pt; pre-op instructions left on pt voice mailbox. Pt instructed not to take diabetic medications the morning of procedure ( Metformin, Glipizide, insulin). Pt made aware to stop taking NSAIDs, otc vitamins and herbal medications.

## 2015-06-19 NOTE — Progress Notes (Signed)
Patient called stating he wanted to cancel surgery due to the area opening up and draining. Patient advised that the area was a large area. Recommended to patient that he still come in for surgery and the area may not be completely drained. Patient reports that he did not want to come in. Recommended he contact on call physician and [facility] to report what happened and let them determine if he needs to come in or not. Provided number to [facility].

## 2015-06-20 MED ORDER — CEFAZOLIN SODIUM-DEXTROSE 2-3 GM-% IV SOLR
2.0000 g | INTRAVENOUS | Status: DC
  Filled 2015-06-20: qty 50

## 2015-06-21 ENCOUNTER — Ambulatory Visit (HOSPITAL_COMMUNITY): Payer: Medicaid Other | Admitting: Certified Registered Nurse Anesthetist

## 2015-06-21 ENCOUNTER — Encounter (HOSPITAL_COMMUNITY): Admission: RE | Disposition: A | Discharge status: Home / Self Care | Payer: Self-pay | Source: Ambulatory Visit

## 2015-06-21 ENCOUNTER — Encounter (HOSPITAL_COMMUNITY): Payer: Self-pay | Admitting: Certified Registered Nurse Anesthetist

## 2015-06-21 ENCOUNTER — Ambulatory Visit (HOSPITAL_COMMUNITY)
Admission: RE | Admit: 2015-06-21 | Discharge: 2015-06-21 | Disposition: A | Discharge status: Home / Self Care | Payer: Medicaid Other | Source: Ambulatory Visit | Attending: General Surgery | Admitting: General Surgery

## 2015-06-21 DIAGNOSIS — L0231 Cutaneous abscess of buttock: Secondary | ICD-10-CM | POA: Insufficient documentation

## 2015-06-21 DIAGNOSIS — Z794 Long term (current) use of insulin: Secondary | ICD-10-CM | POA: Insufficient documentation

## 2015-06-21 DIAGNOSIS — E119 Type 2 diabetes mellitus without complications: Secondary | ICD-10-CM | POA: Insufficient documentation

## 2015-06-21 DIAGNOSIS — E780 Pure hypercholesterolemia: Secondary | ICD-10-CM | POA: Diagnosis not present

## 2015-06-21 DIAGNOSIS — F17200 Nicotine dependence, unspecified, uncomplicated: Secondary | ICD-10-CM | POA: Insufficient documentation

## 2015-06-21 DIAGNOSIS — Z8673 Personal history of transient ischemic attack (TIA), and cerebral infarction without residual deficits: Secondary | ICD-10-CM | POA: Insufficient documentation

## 2015-06-21 DIAGNOSIS — I10 Essential (primary) hypertension: Secondary | ICD-10-CM | POA: Insufficient documentation

## 2015-06-21 HISTORY — PX: IRRIGATION AND DEBRIDEMENT ABSCESS: SHX5252

## 2015-06-21 LAB — GLUCOSE, CAPILLARY
GLUCOSE-CAPILLARY: 483 mg/dL — AB (ref 65–99)
GLUCOSE-CAPILLARY: 77 mg/dL (ref 65–99)
Glucose-Capillary: 359 mg/dL — ABNORMAL HIGH (ref 65–99)
Glucose-Capillary: 107 mg/dL — ABNORMAL HIGH (ref 65–99)
Glucose-Capillary: 98 mg/dL (ref 65–99)

## 2015-06-21 LAB — CBC WITH DIFFERENTIAL/PLATELET
Basophils Absolute: 0 10*3/uL (ref 0.0–0.1)
Basophils Relative: 0 % (ref 0–1)
EOS PCT: 3 % (ref 0–5)
Eosinophils Absolute: 0.3 10*3/uL (ref 0.0–0.7)
HEMATOCRIT: 34 % — AB (ref 39.0–52.0)
HEMOGLOBIN: 12.2 g/dL — AB (ref 13.0–17.0)
LYMPHS ABS: 2.7 10*3/uL (ref 0.7–4.0)
LYMPHS PCT: 30 % (ref 12–46)
MCH: 31.5 pg (ref 26.0–34.0)
MCHC: 35.9 g/dL (ref 30.0–36.0)
MCV: 87.9 fL (ref 78.0–100.0)
Monocytes Absolute: 0.5 10*3/uL (ref 0.1–1.0)
Monocytes Relative: 6 % (ref 3–12)
NEUTROS ABS: 5.4 10*3/uL (ref 1.7–7.7)
NEUTROS PCT: 61 % (ref 43–77)
Platelets: 337 10*3/uL (ref 150–400)
RBC: 3.87 MIL/uL — AB (ref 4.22–5.81)
RDW: 12.6 % (ref 11.5–15.5)
WBC: 8.8 10*3/uL (ref 4.0–10.5)

## 2015-06-21 LAB — BASIC METABOLIC PANEL
Anion gap: 7 (ref 5–15)
BUN: 22 mg/dL — ABNORMAL HIGH (ref 6–20)
CHLORIDE: 102 mmol/L (ref 101–111)
CO2: 25 mmol/L (ref 22–32)
CREATININE: 1.74 mg/dL — AB (ref 0.61–1.24)
Calcium: 9.2 mg/dL (ref 8.9–10.3)
GFR, EST AFRICAN AMERICAN: 53 mL/min — AB (ref >60)
GFR, EST NON AFRICAN AMERICAN: 46 mL/min — AB (ref >60)
GLUCOSE: 497 mg/dL — AB (ref 65–99)
Potassium: 3.6 mmol/L (ref 3.5–5.1)
Sodium: 134 mmol/L — ABNORMAL LOW (ref 135–145)

## 2015-06-21 SURGERY — IRRIGATION AND DEBRIDEMENT ABSCESS
Anesthesia: General | Site: Buttocks

## 2015-06-21 MED ORDER — 0.9 % SODIUM CHLORIDE (POUR BTL) OPTIME
TOPICAL | Status: DC | PRN
  Administered 2015-06-21: 1000 mL

## 2015-06-21 MED ORDER — LACTATED RINGERS IV SOLN
INTRAVENOUS | Status: DC | PRN
  Administered 2015-06-21: 07:00:00 via INTRAVENOUS

## 2015-06-21 MED ORDER — BUPIVACAINE-EPINEPHRINE (PF) 0.25% -1:200000 IJ SOLN
INTRAMUSCULAR | Status: AC
  Filled 2015-06-21: qty 30

## 2015-06-21 MED ORDER — PHENYLEPHRINE HCL 10 MG/ML IJ SOLN
INTRAMUSCULAR | Status: DC | PRN
  Administered 2015-06-21: 80 ug via INTRAVENOUS
  Administered 2015-06-21 (×3): 120 ug via INTRAVENOUS
  Administered 2015-06-21: 80 ug via INTRAVENOUS

## 2015-06-21 MED ORDER — FENTANYL CITRATE (PF) 100 MCG/2ML IJ SOLN
INTRAMUSCULAR | Status: DC | PRN
  Administered 2015-06-21: 100 ug via INTRAVENOUS
  Administered 2015-06-21: 50 ug via INTRAVENOUS

## 2015-06-21 MED ORDER — EPHEDRINE SULFATE 50 MG/ML IJ SOLN
INTRAMUSCULAR | Status: DC | PRN
  Administered 2015-06-21 (×3): 10 mg via INTRAVENOUS

## 2015-06-21 MED ORDER — GLYCOPYRROLATE 0.2 MG/ML IJ SOLN
INTRAMUSCULAR | Status: DC | PRN
  Administered 2015-06-21: 0.4 mg via INTRAVENOUS

## 2015-06-21 MED ORDER — BUPIVACAINE-EPINEPHRINE 0.25% -1:200000 IJ SOLN
INTRAMUSCULAR | Status: DC | PRN
  Administered 2015-06-21: 30 mL

## 2015-06-21 MED ORDER — MIDAZOLAM HCL 2 MG/2ML IJ SOLN
INTRAMUSCULAR | Status: AC
  Filled 2015-06-21: qty 2

## 2015-06-21 MED ORDER — FENTANYL CITRATE (PF) 250 MCG/5ML IJ SOLN
INTRAMUSCULAR | Status: AC
  Filled 2015-06-21: qty 5

## 2015-06-21 MED ORDER — ONDANSETRON HCL 4 MG/2ML IJ SOLN
4.0000 mg | Freq: Once | INTRAMUSCULAR | Status: DC | PRN
Start: 2015-06-21 — End: 2015-06-21

## 2015-06-21 MED ORDER — INSULIN ASPART 100 UNIT/ML ~~LOC~~ SOLN
0.0000 [IU] | Freq: Three times a day (TID) | SUBCUTANEOUS | Status: DC
  Filled 2015-06-21 (×26): qty 0.15

## 2015-06-21 MED ORDER — PROPOFOL 10 MG/ML IV BOLUS
INTRAVENOUS | Status: DC | PRN
  Administered 2015-06-21: 200 mg via INTRAVENOUS

## 2015-06-21 MED ORDER — SODIUM CHLORIDE 0.9 % IV SOLN
250.0000 mL | INTRAVENOUS | Status: DC | PRN
Start: 2015-06-21 — End: 2015-06-21

## 2015-06-21 MED ORDER — MEPERIDINE HCL 25 MG/ML IJ SOLN: 6.2500 mg | INTRAMUSCULAR | Status: DC | PRN

## 2015-06-21 MED ORDER — MORPHINE SULFATE 2 MG/ML IJ SOLN: 2.0000 mg | INTRAMUSCULAR | Status: DC | PRN

## 2015-06-21 MED ORDER — SODIUM CHLORIDE 0.9 % IV SOLN: INTRAVENOUS | Status: DC

## 2015-06-21 MED ORDER — SUGAMMADEX SODIUM 200 MG/2ML IV SOLN
INTRAVENOUS | Status: AC
  Filled 2015-06-21: qty 2

## 2015-06-21 MED ORDER — HYDROMORPHONE HCL 1 MG/ML IJ SOLN: 0.2500 mg | INTRAMUSCULAR | Status: DC | PRN

## 2015-06-21 MED ORDER — ACETAMINOPHEN 325 MG PO TABS
650.0000 mg | ORAL_TABLET | ORAL | Status: DC | PRN
  Filled 2015-06-21: qty 2

## 2015-06-21 MED ORDER — HYDROCODONE-ACETAMINOPHEN 10-325 MG PO TABS: 1.0000 | ORAL_TABLET | Freq: Four times a day (QID) | ORAL | Status: AC | PRN

## 2015-06-21 MED ORDER — LIDOCAINE HCL (CARDIAC) 20 MG/ML IV SOLN
INTRAVENOUS | Status: DC | PRN
  Administered 2015-06-21: 100 mg via INTRAVENOUS

## 2015-06-21 MED ORDER — HYDROCODONE-ACETAMINOPHEN 5-325 MG PO TABS
ORAL_TABLET | ORAL | Status: AC
  Filled 2015-06-21: qty 1

## 2015-06-21 MED ORDER — SODIUM CHLORIDE 0.9 % IJ SOLN: 3.0000 mL | Freq: Two times a day (BID) | INTRAMUSCULAR | Status: DC

## 2015-06-21 MED ORDER — HYDROCODONE-ACETAMINOPHEN 5-325 MG PO TABS
1.0000 | ORAL_TABLET | Freq: Once | ORAL | Status: AC
  Administered 2015-06-21: 1 via ORAL

## 2015-06-21 MED ORDER — INSULIN ASPART 100 UNIT/ML ~~LOC~~ SOLN
10.0000 [IU] | Freq: Once | SUBCUTANEOUS | Status: DC
  Filled 2015-06-21: qty 0.1

## 2015-06-21 MED ORDER — OXYCODONE HCL 5 MG PO TABS: 5.0000 mg | ORAL_TABLET | ORAL | Status: DC | PRN

## 2015-06-21 MED ORDER — INSULIN ASPART 100 UNIT/ML ~~LOC~~ SOLN
SUBCUTANEOUS | Status: AC
  Administered 2015-06-21: 10 [IU]
  Filled 2015-06-21: qty 1

## 2015-06-21 MED ORDER — PROPOFOL 10 MG/ML IV BOLUS
INTRAVENOUS | Status: AC
  Filled 2015-06-21: qty 20

## 2015-06-21 MED ORDER — HYDROCODONE-ACETAMINOPHEN 10-325 MG PO TABS: 1.0000 | ORAL_TABLET | Freq: Once | ORAL | Status: DC

## 2015-06-21 MED ORDER — ACETAMINOPHEN 650 MG RE SUPP
650.0000 mg | RECTAL | Status: DC | PRN
  Filled 2015-06-21: qty 1

## 2015-06-21 MED ORDER — SODIUM CHLORIDE 0.9 % IJ SOLN: 3.0000 mL | INTRAMUSCULAR | Status: DC | PRN

## 2015-06-21 SURGICAL SUPPLY — 49 items
BLADE SURG 15 STRL LF DISP TIS (BLADE) ×1 IMPLANT
BLADE SURG 15 STRL SS (BLADE) ×1
BNDG GAUZE ELAST 4 BULKY (GAUZE/BANDAGES/DRESSINGS) IMPLANT
CANISTER SUCTION 2500CC (MISCELLANEOUS) IMPLANT
CHLORAPREP W/TINT 26ML (MISCELLANEOUS) ×2 IMPLANT
CLEANER TIP ELECTROSURG 2X2 (MISCELLANEOUS) ×2 IMPLANT
COVER SURGICAL LIGHT HANDLE (MISCELLANEOUS) ×2 IMPLANT
DECANTER SPIKE VIAL GLASS SM (MISCELLANEOUS) IMPLANT
DRAPE EXTREMITY T 121X128X90 (DRAPE) IMPLANT
DRAPE LAPAROSCOPIC ABDOMINAL (DRAPES) ×2 IMPLANT
DRSG PAD ABDOMINAL 8X10 ST (GAUZE/BANDAGES/DRESSINGS) IMPLANT
ELECT REM PT RETURN 9FT ADLT (ELECTROSURGICAL) ×2
ELECTRODE REM PT RTRN 9FT ADLT (ELECTROSURGICAL) ×1 IMPLANT
GAUZE IODOFORM PACK 1/2 7832 (GAUZE/BANDAGES/DRESSINGS) ×2 IMPLANT
GAUZE SPONGE 4X4 12PLY STRL (GAUZE/BANDAGES/DRESSINGS) ×2 IMPLANT
GAUZE SPONGE 4X4 16PLY XRAY LF (GAUZE/BANDAGES/DRESSINGS) IMPLANT
GLOVE BIO SURGEON STRL SZ7 (GLOVE) ×2 IMPLANT
GLOVE BIOGEL PI IND STRL 6.5 (GLOVE) ×1 IMPLANT
GLOVE BIOGEL PI IND STRL 7.0 (GLOVE) ×2 IMPLANT
GLOVE BIOGEL PI IND STRL 7.5 (GLOVE) ×1 IMPLANT
GLOVE BIOGEL PI INDICATOR 6.5 (GLOVE) ×1
GLOVE BIOGEL PI INDICATOR 7.0 (GLOVE) ×2
GLOVE BIOGEL PI INDICATOR 7.5 (GLOVE) ×1
GLOVE SURG SS PI 7.0 STRL IVOR (GLOVE) ×4 IMPLANT
GOWN STRL REUS W/ TWL LRG LVL3 (GOWN DISPOSABLE) ×2 IMPLANT
GOWN STRL REUS W/TWL LRG LVL3 (GOWN DISPOSABLE) ×2
KIT BASIN OR (CUSTOM PROCEDURE TRAY) ×2 IMPLANT
KIT ROOM TURNOVER OR (KITS) ×2 IMPLANT
NEEDLE HYPO 25X1 1.5 SAFETY (NEEDLE) ×2 IMPLANT
NS IRRIG 1000ML POUR BTL (IV SOLUTION) ×2 IMPLANT
PACK SURGICAL SETUP 50X90 (CUSTOM PROCEDURE TRAY) ×2 IMPLANT
PAD ARMBOARD 7.5X6 YLW CONV (MISCELLANEOUS) ×4 IMPLANT
PENCIL BUTTON HOLSTER BLD 10FT (ELECTRODE) ×2 IMPLANT
SPECIMEN JAR SMALL (MISCELLANEOUS) ×2 IMPLANT
SPONGE GAUZE 4X4 12PLY STER LF (GAUZE/BANDAGES/DRESSINGS) ×2 IMPLANT
SPONGE LAP 18X18 X RAY DECT (DISPOSABLE) ×2 IMPLANT
SUT MNCRL AB 4-0 PS2 18 (SUTURE) IMPLANT
SUT VIC AB 3-0 SH 18 (SUTURE) ×2 IMPLANT
SUT VIC AB 3-0 SH 27 (SUTURE)
SUT VIC AB 3-0 SH 27XBRD (SUTURE) IMPLANT
SWAB COLLECTION DEVICE MRSA (MISCELLANEOUS) IMPLANT
SYR BULB 3OZ (MISCELLANEOUS) ×2 IMPLANT
SYR CONTROL 10ML LL (SYRINGE) ×2 IMPLANT
TOWEL OR 17X24 6PK STRL BLUE (TOWEL DISPOSABLE) ×2 IMPLANT
TOWEL OR 17X26 10 PK STRL BLUE (TOWEL DISPOSABLE) ×2 IMPLANT
TUBE ANAEROBIC SPECIMEN COL (MISCELLANEOUS) IMPLANT
TUBE CONNECTING 12X1/4 (SUCTIONS) IMPLANT
WATER STERILE IRR 1000ML POUR (IV SOLUTION) IMPLANT
YANKAUER SUCT BULB TIP NO VENT (SUCTIONS) IMPLANT

## 2015-06-21 NOTE — Transfer of Care (Signed)
Immediate Anesthesia Transfer of Care Note  Patient: Keith [name]  Procedure(s) Performed: Procedure(s): IRRIGATION AND DEBRIDEMENT ABSCESS (N/A)  Patient Location: PACU  Anesthesia Type:General  Level of Consciousness: lethargic  Airway & Oxygen Therapy: Patient Spontanous Breathing and Patient connected to face mask oxygen  Post-op Assessment: Report given to RN and Post -op Vital signs reviewed and stable  Post vital signs: Reviewed and stable  Last Vitals:  Filed Vitals:   06/21/15 0608  BP: 115/64  Pulse: 49  Temp: 36.8 C  Resp: 20    Complications: No apparent anesthesia complications

## 2015-06-21 NOTE — Op Note (Signed)
1.  Progress note or procedure note with a detailed description of the procedure. Preoperative diagnosis: chronic gluteal abscess Postoperative diagnosis: same as above Procedure: incision and drainage of gluteal abscess, debridement of 6x4x4 cm area of skin and subcutaneous tissue Surgeon: Dr Serita Grammes Anes: general EBL: 20 cc Drains none Specimens cultures to micro, tissue to pathology Complications none dispo to recovery stable Sponge count correct  Indications: This is a 79 yom male dm who presented with one month history of chronic gluteal abscess. We discussed operative treatment.  Procedure: After informed consent was obtained the patient was taken to the OR.  He was placed under general anesthesia. SCDs were in place.  Antibiotics were given.  He was placed in left lateral position and padded.  He was prepped and draped in standard sterile surgical fashion. Timeout performed.  I made an elliptical incision measuring 6x4 cm and removed the ulcerated skin.  I drained fluid and purulence. Cultures were taken The tissue was necrotic and I removed a portion measuring 6x4x4 cm in total all the way down to gluteus.  This was then clean.  I irrigated.  Hemostasis was obtained. This was packed and dressings placed. He tolerated well and was transferred to recovery stable.  2.  Tool used for debridement (curette, scapel, etc.)  Scalpel , cautery  3.  Frequency of surgical debridement.   once  4.  Measurement of total devitalized tissue (wound surface) before and after surgical debridement.   6x4x4 cm  5.  Area and depth of devitalized tissue removed from wound.  Same as above  6.  Blood loss and description of tissue removed.  Necrotic, 20 cc ebl  7.  Evidence of the progress of the wound's response to treatment.  A.  Current wound volume (current dimensions and depth).  6x4x4  B.  Presence (and extent of) of infection.  removed  C.  Presence (and extent of) of non viable tissue.   Removed all nonviable tissue  D.  Other material in the wound that is expected to inhibit healing.  none  8.  Was there any viable tissue removed (measurements): no

## 2015-06-21 NOTE — Anesthesia Preprocedure Evaluation (Signed)
Anesthesia Evaluation  Patient identified by MRN, date of birth, ID band Patient awake    Reviewed: Allergy & Precautions, NPO status , Patient's Chart, lab work & pertinent test results  Airway Mallampati: I  TM Distance: >3 FB Neck ROM: Full    Dental   Pulmonary Current Smoker,    Pulmonary exam normal       Cardiovascular hypertension, Pt. on medications Normal cardiovascular exam    Neuro/Psych    GI/Hepatic   Endo/Other  diabetes, Poorly Controlled, Type 2, Insulin Dependent, Oral Hypoglycemic Agents  Renal/GU      Musculoskeletal   Abdominal   Peds  Hematology   Anesthesia Other Findings   Reproductive/Obstetrics                             Anesthesia Physical Anesthesia Plan  ASA: III  Anesthesia Plan: General   Post-op Pain Management:    Induction: Intravenous  Airway Management Planned: LMA  Additional Equipment:   Intra-op Plan:   Post-operative Plan: Extubation in OR  Informed Consent: I have reviewed the patients History and Physical, chart, labs and discussed the procedure including the risks, benefits and alternatives for the proposed anesthesia with the patient or authorized representative who has indicated his/her understanding and acceptance.     Plan Discussed with: CRNA and Surgeon  Anesthesia Plan Comments:         Anesthesia Quick Evaluation

## 2015-06-21 NOTE — Discharge Instructions (Signed)
Dressing Change °A dressing is a material placed over wounds. It keeps the wound clean, dry, and protected from further injury. This provides an environment that favors wound healing.  °BEFORE YOU BEGIN °· Get your supplies together. Things you may need include: °¨ Saline solution. °¨ Flexible gauze dressing. °¨ Medicated cream. °¨ Tape. °¨ Gloves. °¨ Abdominal dressing pads. °¨ Gauze squares. °¨ Plastic bags. °· Take pain medicine 30 minutes before the dressing change if you need it. °· Take a shower before you do the first dressing change of the day. Use plastic wrap or a plastic bag to prevent the dressing from getting wet. °REMOVING YOUR OLD DRESSING  °· Wash your hands with soap and water. Dry your hands with a clean towel. °· Put on your gloves. °· Remove any tape. °· Carefully remove the old dressing. If the dressing sticks, you may dampen it with warm water to loosen it, or follow your caregiver's specific directions. °· Remove any gauze or packing tape that is in your wound. °· Take off your gloves. °· Put the gloves, tape, gauze, or any packing tape into a plastic bag. °CHANGING YOUR DRESSING °· Open the supplies. °· Take the cap off the saline solution. °· Open the gauze package so that the gauze remains on the inside of the package. °· Put on your gloves. °· Clean your wound as told by your caregiver. °· If you have been told to keep your wound dry, follow those instructions. °· Your caregiver may tell you to do one or more of the following: °¨ Pick up the gauze. Pour the saline solution over the gauze. Squeeze out the extra saline solution. °¨ Put medicated cream or other medicine on your wound if you have been told to do so. °¨ Put the solution soaked gauze only in your wound, not on the skin around it. °¨ Pack your wound loosely or as told by your caregiver. °¨ Put dry gauze on your wound. °¨ Put abdominal dressing pads over the dry gauze if your wet gauze soaks through. °· Tape the abdominal dressing  pads in place so they will not fall off. Do not wrap the tape completely around the affected part (arm, leg, abdomen). °· Wrap the dressing pads with a flexible gauze dressing to secure it in place. °· Take off your gloves. Put them in the plastic bag with the old dressing. Tie the bag shut and throw it away. °· Keep the dressing clean and dry until your next dressing change. °· Wash your hands. °SEEK MEDICAL CARE IF: °· Your skin around the wound looks red. °· Your wound feels more tender or sore. °· You see pus in the wound. °· Your wound smells bad. °· You have a fever. °· Your skin around the wound has a rash that itches and burns. °· You see black or yellow skin in your wound that was not there before. °· You feel nauseous, throw up, and feel very tired. °Document Released: 12/14/2004 Document Revised: 01/29/2012 Document Reviewed: 09/18/2011 °Exit[employer]® Patient Information ©2015 [employer]. This information is not intended to replace advice given to you by your health care provider. Make sure you discuss any questions you have with your health care provider. ° °

## 2015-06-21 NOTE — Anesthesia Postprocedure Evaluation (Signed)
Anesthesia Post Note  Patient: Keith [name]  Procedure(s) Performed: Procedure(s) (LRB): IRRIGATION AND DEBRIDEMENT ABSCESS (N/A)  Anesthesia type: general  Patient location: PACU  Post pain: Pain level controlled  Post assessment: Patient's Cardiovascular Status Stable  Last Vitals:  Filed Vitals:   06/21/15 0939  BP:   Pulse:   Temp: 36.2 C  Resp:     Post vital signs: Reviewed and stable  Level of consciousness: sedated  Complications: No apparent anesthesia complications

## 2015-06-21 NOTE — Interval H&P Note (Signed)
History and Physical Interval Note:  06/21/2015 7:02 AM  Keith [name]  has presented today for surgery, with the diagnosis of BUTTOCK ABSCESS  The various methods of treatment have been discussed with the patient and family. After consideration of risks, benefits and other options for treatment, the patient has consented to  Procedure(s): IRRIGATION AND DEBRIDEMENT ABSCESS (N/A) as a surgical intervention .  The patient's history has been reviewed, patient examined, no change in status, stable for surgery.  I have reviewed the patient's chart and labs.  Questions were answered to the patient's satisfaction.     Ambrose Wile

## 2015-06-21 NOTE — H&P (Signed)
  57 yom with dm, prior stroke who presents with a month history of an enlarging right buttock mass. this has not been draining. he does not have a fever. it is painful. having bms without difficulty   Other Problems Jeralyn Ruths, [place]; 06/17/2015 4:19 PM) Cerebrovascular Accident Diabetes Mellitus High blood pressure Hypercholesterolemia  Past Surgical History Jeralyn Ruths, [place]; 06/17/2015 4:19 PM) No pertinent past surgical history  Diagnostic Studies History Jeralyn Ruths, [place]; 06/17/2015 4:19 PM) Colonoscopy never  Allergies Jeralyn Ruths, CMA; 06/17/2015 4:24 PM) No Known Drug Allergies07/28/2016  Medication History Jeralyn Ruths, CMA; 06/17/2015 4:24 PM) Norvasc (5MG  Tablet, Oral daily) Active. Lantus for OptiClik (100UNIT/ML Solution, Subcutaneous as directed) Active. Pravachol (40MG  Tablet, Oral daily) Active. Medications Reconciled  Social History Jeralyn Ruths, [place]; 06/17/2015 4:19 PM) Alcohol use Occasional alcohol use. Caffeine use Carbonated beverages, Coffee, Tea. No drug use Tobacco use Current every day smoker.  Family History Jeralyn Ruths, [place]; 06/17/2015 4:19 PM) Cerebrovascular Accident Father, Mother. Diabetes Mellitus Father, Sister. Hypertension Brother, Father, Mother, Sister.  Review of Systems Jearld Fenton Morris CMA; 06/17/2015 4:19 PM) General Not Present- Appetite Loss, Chills, Fatigue, Fever, Night Sweats, Weight Gain and Weight Loss. Skin Present- New Lesions. Not Present- Change in Wart/Mole, Dryness, Hives, Jaundice, Non-Healing Wounds, Rash and Ulcer. HEENT Not Present- Earache, Hearing Loss, Hoarseness, Nose Bleed, Oral Ulcers, Ringing in the Ears, Seasonal Allergies, Sinus Pain, Sore Throat, Visual Disturbances, Wears glasses/contact lenses and Yellow Eyes. Respiratory Not Present- Bloody sputum, Chronic Cough, Difficulty Breathing, Snoring and Wheezing. Breast Not Present- Breast Mass, Breast Pain, Nipple Discharge and Skin  Changes. Cardiovascular Not Present- Chest Pain, Difficulty Breathing Lying Down, Leg Cramps, Palpitations, Rapid Heart Rate, Shortness of Breath and Swelling of Extremities. Gastrointestinal Not Present- Abdominal Pain, Bloating, Bloody Stool, Change in Bowel Habits, Chronic diarrhea, Constipation, Difficulty Swallowing, Excessive gas, Gets full quickly at meals, Hemorrhoids, Indigestion, Nausea, Rectal Pain and Vomiting. Male Genitourinary Not Present- Blood in Urine, Change in Urinary Stream, Frequency, Impotence, Nocturia, Painful Urination, Urgency and Urine Leakage. Musculoskeletal Not Present- Back Pain, Joint Pain, Joint Stiffness, Muscle Pain, Muscle Weakness and Swelling of Extremities. Neurological Not Present- Decreased Memory, Fainting, Headaches, Numbness, Seizures, Tingling, Tremor, Trouble walking and Weakness. Psychiatric Not Present- Anxiety, Bipolar, Change in Sleep Pattern, Depression, Fearful and Frequent crying. Endocrine Not Present- Cold Intolerance, Excessive Hunger, Hair Changes, Heat Intolerance, Hot flashes and New Diabetes. Hematology Not Present- Easy Bruising, Excessive bleeding, Gland problems, HIV and Persistent Infections.   Vitals Jearld Fenton Morris CMA; 06/17/2015 4:25 PM) 06/17/2015 4:24 PM Weight: 151.6 lb Height: 67in Body Surface Area: 1.8 m Body Mass Index: 23.74 kg/m Temp.: 99.27F(Oral)  Pulse: 68 (Regular)  Resp.: 16 (Unlabored)  BP: 130/90 (Sitting, Left Arm, Standard)    Physical Exam Rolm Bookbinder MD; 06/17/2015 5:00 PM) General Mental Status-Alert. Orientation-Oriented X3.  Chest and Lung Exam Chest and lung exam reveals -on auscultation, normal breath sounds, no adventitious sounds and normal vocal resonance.  Cardiovascular Cardiovascular examination reveals -normal heart sounds, regular rate and rhythm with no murmurs.  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 will start abx. i think this is abscess but has been present for some time. discussed procedure, recovery, open wound

## 2015-06-21 NOTE — Anesthesia Procedure Notes (Signed)
Procedure Name: LMA Insertion Date/Time: 06/21/2015 7:43 AM Performed by: Shirlyn Goltz Pre-anesthesia Checklist: Patient identified, Emergency Drugs available, Suction available and Patient being monitored Patient Re-evaluated:Patient Re-evaluated prior to inductionOxygen Delivery Method: Circle system utilized Preoxygenation: Pre-oxygenation with 100% oxygen Intubation Type: IV induction Ventilation: Mask ventilation without difficulty LMA: LMA inserted LMA Size: 5.0 Number of attempts: 1 Placement Confirmation: positive ETCO2 and breath sounds checked- equal and bilateral Tube secured with: Tape Dental Injury: Teeth and Oropharynx as per pre-operative assessment

## 2015-06-21 NOTE — Progress Notes (Signed)
Pt and girlfriend state that area buttocks has opened up and is draining.  Uncertain if surgery should be done.  Pt is diabetic and took all of home diabetic medications yesterday, with none of them this a.m.  CBG 483.  Has not eaten or drank since 2300 7/31.  Dr Al Corpus notified and states we should wait to treat sugar until surgery is confirmed. Will discuss with Dr. Servando Snare assigned to pt when they arrive.

## 2015-06-22 ENCOUNTER — Encounter (HOSPITAL_COMMUNITY): Payer: Self-pay | Admitting: General Surgery

## 2015-06-22 NOTE — Addendum Note (Signed)
Addendum  created 06/22/15 1235 by Shirlyn Goltz, CRNA   Modules edited: Anesthesia Medication Administration

## 2015-06-24 LAB — CULTURE, ROUTINE-ABSCESS: CULTURE: NO GROWTH

## 2015-06-25 LAB — ANAEROBIC CULTURE

## 2016-08-15 ENCOUNTER — Ambulatory Visit
Admission: RE | Admit: 2016-08-15 | Discharge: 2016-08-15 | Disposition: A | Discharge status: Home / Self Care | Payer: Medicaid Other | Source: Ambulatory Visit | Attending: Cardiovascular Disease | Admitting: Cardiovascular Disease

## 2016-08-15 ENCOUNTER — Other Ambulatory Visit: Payer: Self-pay | Admitting: Cardiovascular Disease

## 2016-08-15 DIAGNOSIS — M25561 Pain in right knee: Secondary | ICD-10-CM

## 2016-12-05 ENCOUNTER — Encounter (HOSPITAL_COMMUNITY): Payer: Self-pay | Admitting: Emergency Medicine

## 2016-12-05 ENCOUNTER — Ambulatory Visit (HOSPITAL_COMMUNITY)
Admission: EM | Admit: 2016-12-05 | Discharge: 2016-12-05 | Disposition: A | Discharge status: Home / Self Care | Payer: Medicaid Other | Attending: Family Medicine | Admitting: Family Medicine

## 2016-12-05 DIAGNOSIS — Z2089 Contact with and (suspected) exposure to other communicable diseases: Secondary | ICD-10-CM | POA: Insufficient documentation

## 2016-12-05 DIAGNOSIS — Z202 Contact with and (suspected) exposure to infections with a predominantly sexual mode of transmission: Secondary | ICD-10-CM

## 2016-12-05 MED ORDER — METRONIDAZOLE 500 MG PO TABS: 500.0000 mg | ORAL_TABLET | Freq: Two times a day (BID) | ORAL | 0 refills | Status: DC

## 2016-12-05 NOTE — Discharge Instructions (Signed)
We will call with positive test results and treat as indicated

## 2016-12-05 NOTE — ED Provider Notes (Signed)
[facility]    CSN: [id number] Arrival date & time: 12/05/16  1127     History   Chief Complaint Chief Complaint  Patient presents with  . Exposure to STD    HPI SYE SCHROEPFER is a 46 y.o. male.   The history is provided by the patient.  Exposure to STD  This is a new problem. The current episode started more than 2 days ago (sts male contacted him 3d ago that she had trich, pt with no sx.). Pertinent negatives include no abdominal pain.    Past Medical History:  Diagnosis Date  . Diabetes mellitus   . Hypercholesteremia   . Hypertension   . Stroke [facility])     Patient Active Problem List   Diagnosis Date Noted  . 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 IRRIGATION AND DEBRIDEMENT ABSCESS N/A 06/21/2015   Procedure: IRRIGATION AND DEBRIDEMENT ABSCESS;  Surgeon: Rolm Bookbinder, MD;  Location: [facility];  Service: General;  Laterality: N/A;       Home Medications    Prior to Admission medications   Medication Sig Start Date End Date Taking? Authorizing Provider  amLODipine (NORVASC) 5 MG tablet Take 5 mg by mouth 2 (two) times daily.    Yes Historical Provider, MD  aspirin 325 MG tablet Take 325 mg by mouth daily.   Yes Historical Provider, MD  glipiZIDE (GLUCOTROL XL) 5 MG 24 hr tablet Take 5 mg by mouth daily with breakfast.   Yes Historical Provider, MD  insulin glargine (LANTUS) 100 UNIT/ML injection Inject 20 Units into the skin 2 (two) times daily. Patient taking differently: Inject 10 Units into the skin 2 (two) times daily.  03/13/12  Yes Charolette Forward, MD  lisinopril-hydrochlorothiazide (PRINZIDE,ZESTORETIC) 10-12.5 MG per tablet Take 1 tablet by mouth daily.   Yes Historical Provider, MD  metFORMIN (GLUCOPHAGE) 500 MG tablet Take 500 mg by mouth 2 (two) times daily with a meal.   Yes Historical Provider, MD  metoprolol (LOPRESSOR) 50 MG tablet Take 50 mg by mouth 2 (two)  times daily.   Yes Historical Provider, MD  pravastatin (PRAVACHOL) 40 MG tablet Take 40 mg by mouth at bedtime.   Yes Historical Provider, MD  sertraline (ZOLOFT) 50 MG tablet Take 50 mg by mouth daily.   Yes Historical Provider, MD  HYDROcodone-acetaminophen (NORCO/VICODIN) 5-325 MG per tablet Take 1 tablet by mouth at bedtime as needed for moderate pain.    Historical Provider, MD    Family History History reviewed. No pertinent family history.  Social History Social History  Substance Use Topics  . Smoking status: Current Every Day Smoker    Types: Cigars  . Smokeless tobacco: Never Used     Comment: Black & Milds 1 a day  . Alcohol use Yes     Allergies   Patient has no known allergies.   Review of Systems Review of Systems  Constitutional: Negative.   Gastrointestinal: Negative for abdominal pain.  Genitourinary: Negative.  Negative for discharge, dysuria, penile pain, scrotal swelling and testicular pain.  All other systems reviewed and are negative.    Physical Exam Triage Vital Signs ED Triage Vitals [12/05/16 1151]  Enc Vitals Group     BP 138/88     Pulse Rate 75     Resp      Temp 99.2 F (37.3 C)     Temp Source Oral     SpO2 100 %  Weight      Height      Head Circumference      Peak Flow      Pain Score      Pain Loc      Pain Edu?      Excl. in [place]?    No data found.   Updated Vital Signs BP 138/88 (BP Location: Left Arm)   Pulse 75   Temp 99.2 F (37.3 C) (Oral)   SpO2 100%   Visual Acuity Right Eye Distance:   Left Eye Distance:   Bilateral Distance:    Right Eye Near:   Left Eye Near:    Bilateral Near:     Physical Exam  Constitutional: He is oriented to person, place, and time. He appears well-developed and well-nourished.  Abdominal: Soft. Bowel sounds are normal.  Genitourinary: Penis normal. No penile tenderness.  Neurological: He is alert and oriented to person, place, and time.  Nursing note and vitals  reviewed.    UC Treatments / Results  Labs (all labs ordered are listed, but only abnormal results are displayed) Labs Reviewed  URINE CYTOLOGY ANCILLARY ONLY    EKG  EKG Interpretation None       Radiology No results found.  Procedures Procedures (including critical care time)  Medications Ordered in UC Medications - No data to display   Initial Impression / Assessment and Plan / UC Course  I have reviewed the triage vital signs and the nursing notes.  Pertinent labs & imaging results that were available during my care of the patient were reviewed by me and considered in my medical decision making (see chart for details).  Clinical Course       Final Clinical Impressions(s) / UC Diagnoses   Final diagnoses:  None    New Prescriptions New Prescriptions   No medications on file     Billy Fischer, MD 12/19/16 2200

## 2016-12-05 NOTE — ED Triage Notes (Signed)
Pt reports having a sexual encounter on New Years.  This person reported to him three days ago that she had Trichomonas.  Pt denies any symptoms at this time.

## 2016-12-07 LAB — URINE CYTOLOGY ANCILLARY ONLY
Chlamydia: NEGATIVE
NEISSERIA GONORRHEA: NEGATIVE
Trichomonas: NEGATIVE

## 2017-07-02 ENCOUNTER — Ambulatory Visit
Admission: RE | Admit: 2017-07-02 | Discharge: 2017-07-02 | Disposition: A | Discharge status: Home / Self Care | Payer: Medicaid Other | Source: Ambulatory Visit | Attending: Cardiovascular Disease | Admitting: Cardiovascular Disease

## 2017-07-02 ENCOUNTER — Other Ambulatory Visit: Payer: Self-pay | Admitting: Cardiovascular Disease

## 2017-07-02 DIAGNOSIS — M25561 Pain in right knee: Secondary | ICD-10-CM

## 2018-04-05 ENCOUNTER — Encounter (HOSPITAL_COMMUNITY): Payer: Self-pay | Admitting: Emergency Medicine

## 2018-04-05 ENCOUNTER — Other Ambulatory Visit: Payer: Self-pay

## 2018-04-05 ENCOUNTER — Emergency Department (HOSPITAL_COMMUNITY): Payer: Medicaid Other

## 2018-04-05 ENCOUNTER — Emergency Department (HOSPITAL_COMMUNITY)
Admission: EM | Admit: 2018-04-05 | Discharge: 2018-04-05 | Discharge status: Against Medical Advice | Payer: Medicaid Other | Attending: Emergency Medicine | Admitting: Emergency Medicine

## 2018-04-05 DIAGNOSIS — R51 Headache: Secondary | ICD-10-CM | POA: Insufficient documentation

## 2018-04-05 DIAGNOSIS — Z5321 Procedure and treatment not carried out due to patient leaving prior to being seen by health care provider: Secondary | ICD-10-CM | POA: Diagnosis not present

## 2018-04-05 LAB — COMPREHENSIVE METABOLIC PANEL
ALT: 16 U/L — AB (ref 17–63)
AST: 17 U/L (ref 15–41)
Albumin: 3.3 g/dL — ABNORMAL LOW (ref 3.5–5.0)
Alkaline Phosphatase: 58 U/L (ref 38–126)
Anion gap: 11 (ref 5–15)
BILIRUBIN TOTAL: 0.6 mg/dL (ref 0.3–1.2)
BUN: 27 mg/dL — ABNORMAL HIGH (ref 6–20)
CHLORIDE: 104 mmol/L (ref 101–111)
CO2: 27 mmol/L (ref 22–32)
CREATININE: 2.62 mg/dL — AB (ref 0.61–1.24)
Calcium: 9.1 mg/dL (ref 8.9–10.3)
GFR, EST AFRICAN AMERICAN: 32 mL/min — AB (ref >60)
GFR, EST NON AFRICAN AMERICAN: 27 mL/min — AB (ref >60)
Glucose, Bld: 80 mg/dL (ref 65–99)
Potassium: 3.4 mmol/L — ABNORMAL LOW (ref 3.5–5.1)
Sodium: 142 mmol/L (ref 135–145)
Total Protein: 6.3 g/dL — ABNORMAL LOW (ref 6.5–8.1)

## 2018-04-05 LAB — DIFFERENTIAL
Abs Immature Granulocytes: 0 10*3/uL (ref 0.0–0.1)
Basophils Absolute: 0 10*3/uL (ref 0.0–0.1)
Basophils Relative: 0 %
EOS ABS: 0.1 10*3/uL (ref 0.0–0.7)
Eosinophils Relative: 1 %
Immature Granulocytes: 0 %
LYMPHS ABS: 2.1 10*3/uL (ref 0.7–4.0)
Lymphocytes Relative: 23 %
MONO ABS: 0.5 10*3/uL (ref 0.1–1.0)
Monocytes Relative: 6 %
Neutro Abs: 6.3 10*3/uL (ref 1.7–7.7)
Neutrophils Relative %: 70 %

## 2018-04-05 LAB — I-STAT TROPONIN, ED: TROPONIN I, POC: 0 ng/mL (ref 0.00–0.08)

## 2018-04-05 LAB — PROTIME-INR
INR: 0.89
Prothrombin Time: 11.9 seconds (ref 11.4–15.2)

## 2018-04-05 LAB — CBC
HEMATOCRIT: 40.5 % (ref 39.0–52.0)
HEMOGLOBIN: 13.9 g/dL (ref 13.0–17.0)
MCH: 31 pg (ref 26.0–34.0)
MCHC: 34.3 g/dL (ref 30.0–36.0)
MCV: 90.4 fL (ref 78.0–100.0)
PLATELETS: 282 10*3/uL (ref 150–400)
RBC: 4.48 MIL/uL (ref 4.22–5.81)
RDW: 13.3 % (ref 11.5–15.5)
WBC: 9.1 10*3/uL (ref 4.0–10.5)

## 2018-04-05 LAB — APTT: APTT: 26 s (ref 24–36)

## 2018-04-05 NOTE — ED Triage Notes (Signed)
Patient here with complaint of headache x1 month. Patient states he does not have headaches normally and has not had one since 2012 when he had a stroke. Patient reports his PCP told him to start taking half of his antihypertensive medications and the headache began after this. Two weeks ago, PCP told him to take the whole pill again but headache persists. No other neuro complaints, no neuro deficits.

## 2018-05-09 ENCOUNTER — Emergency Department (HOSPITAL_COMMUNITY)
Admission: EM | Admit: 2018-05-09 | Discharge: 2018-05-09 | Disposition: A | Discharge status: Home / Self Care | Payer: Medicaid Other | Attending: Emergency Medicine | Admitting: Emergency Medicine

## 2018-05-09 ENCOUNTER — Encounter (HOSPITAL_COMMUNITY): Payer: Self-pay | Admitting: Emergency Medicine

## 2018-05-09 DIAGNOSIS — E10649 Type 1 diabetes mellitus with hypoglycemia without coma: Secondary | ICD-10-CM | POA: Insufficient documentation

## 2018-05-09 DIAGNOSIS — Z79899 Other long term (current) drug therapy: Secondary | ICD-10-CM | POA: Diagnosis not present

## 2018-05-09 DIAGNOSIS — F17290 Nicotine dependence, other tobacco product, uncomplicated: Secondary | ICD-10-CM | POA: Insufficient documentation

## 2018-05-09 DIAGNOSIS — Y999 Unspecified external cause status: Secondary | ICD-10-CM | POA: Diagnosis not present

## 2018-05-09 DIAGNOSIS — Y939 Activity, unspecified: Secondary | ICD-10-CM | POA: Insufficient documentation

## 2018-05-09 DIAGNOSIS — E162 Hypoglycemia, unspecified: Secondary | ICD-10-CM

## 2018-05-09 DIAGNOSIS — Z794 Long term (current) use of insulin: Secondary | ICD-10-CM | POA: Insufficient documentation

## 2018-05-09 DIAGNOSIS — Y929 Unspecified place or not applicable: Secondary | ICD-10-CM | POA: Insufficient documentation

## 2018-05-09 DIAGNOSIS — Z041 Encounter for examination and observation following transport accident: Secondary | ICD-10-CM | POA: Diagnosis present

## 2018-05-09 LAB — CBG MONITORING, ED
GLUCOSE-CAPILLARY: 266 mg/dL — AB (ref 65–99)
Glucose-Capillary: 123 mg/dL — ABNORMAL HIGH (ref 65–99)
Glucose-Capillary: 142 mg/dL — ABNORMAL HIGH (ref 65–99)
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
## 2018-05-09 NOTE — Discharge Instructions (Addendum)
Monitor your blood sugar at home.  Follow-up with your PCP.  Return to the ER for uncontrolled blood sugars, any worsening or concerning symptoms.

## 2018-07-19 IMAGING — CT CT HEAD W/O CM
4 series · 16 of 47 positions shown, 18 images · non-contrast
Comparison: 02/22/2012

CLINICAL DATA: Headaches for 1 month, history stroke, hypertension,
diabetes mellitus, smoker

EXAM:
CT HEAD WITHOUT CONTRAST
TECHNIQUE: Contiguous axial images were obtained from the base of the skull
through the vertex without intravenous contrast. Sagittal and
coronal MPR images reconstructed from axial data set.

[Series 3: head wo · axial · 0.45mm/px · z∈[-122,-2]mm · 7 of 34 slices shown, 9 images]
[im 5/34  brain]
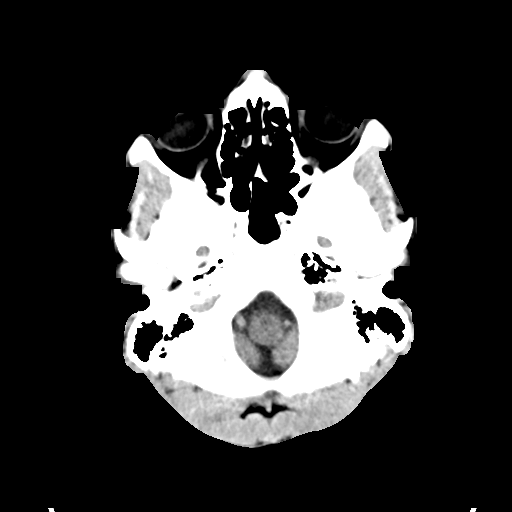
[im 5/34  bone]
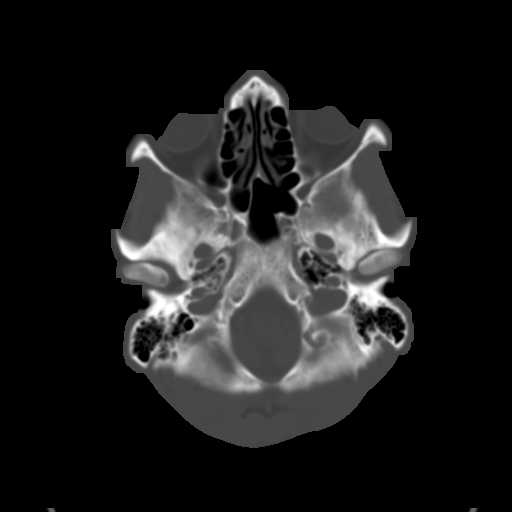
[im 9/34  brain]
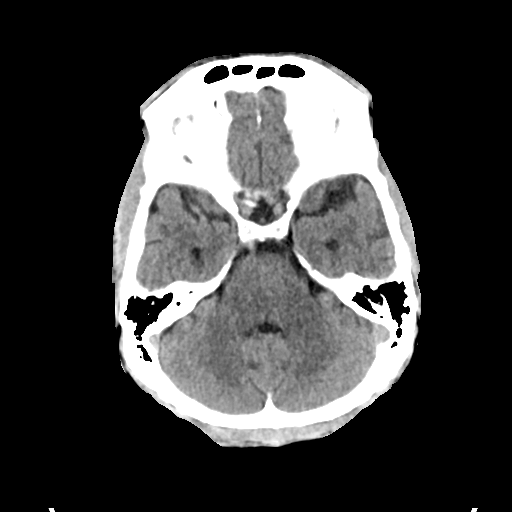
[im 13/34  brain]
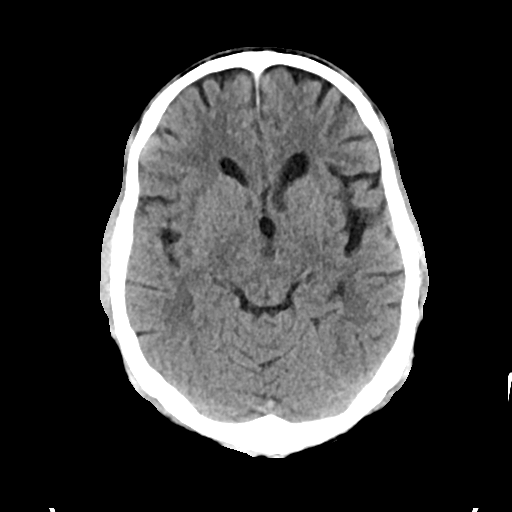
[im 17/34  brain]
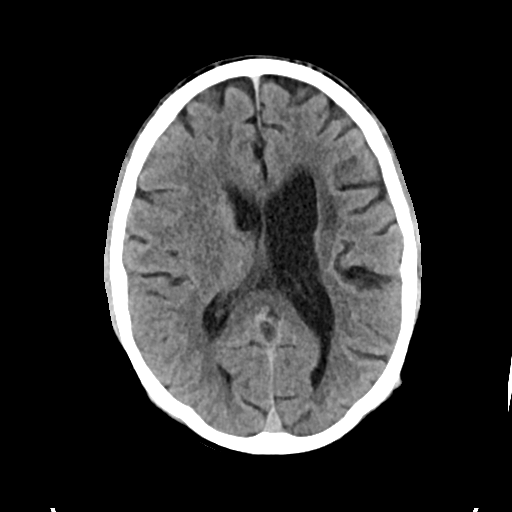
[im 21/34  brain]
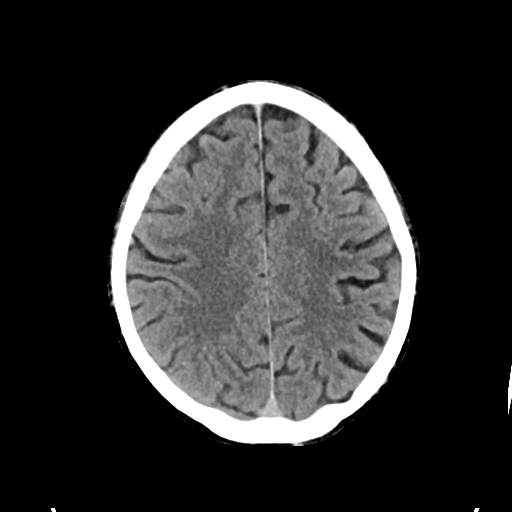
[im 21/34  bone]
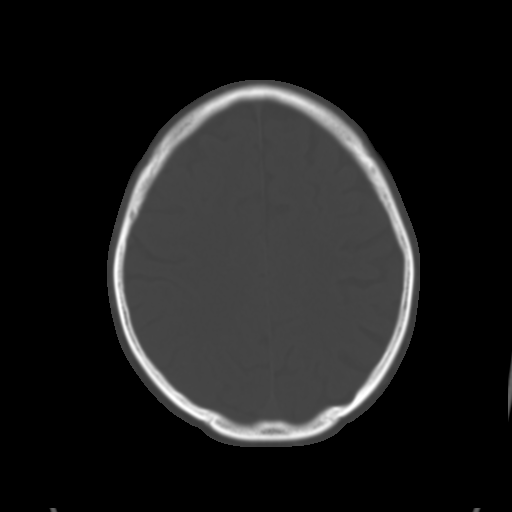
[im 25/34  brain]
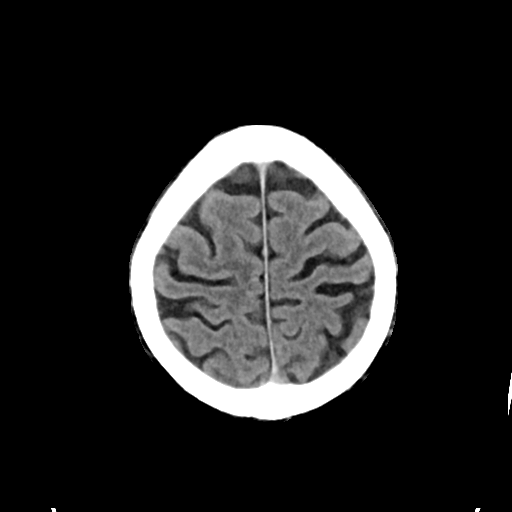
[im 29/34  brain]
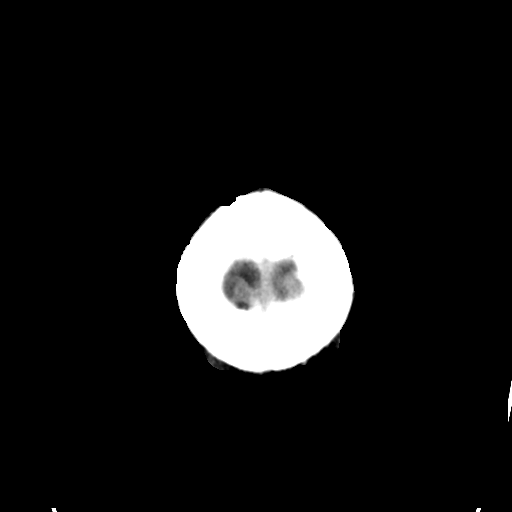

[Series 4: head bone · axial · 0.45mm/px · z∈[-126,-92]mm · 3 of 85 slices shown]
[im 9/85  bone]
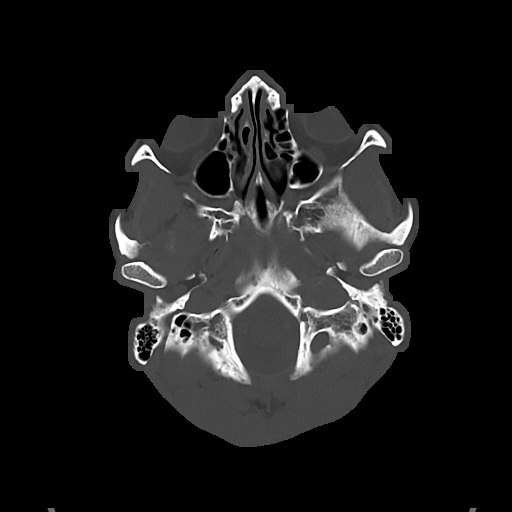
[im 17/85  bone]
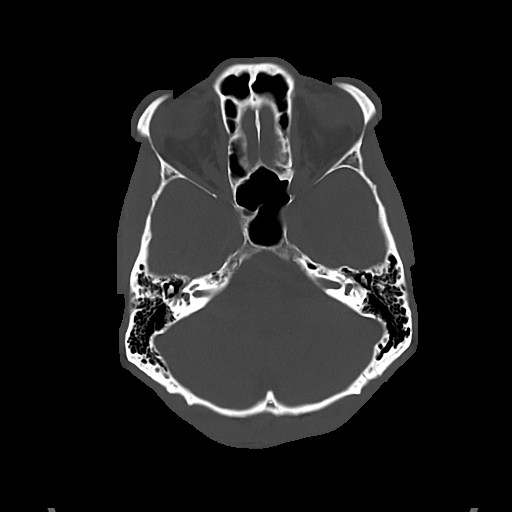
[im 26/85  bone]
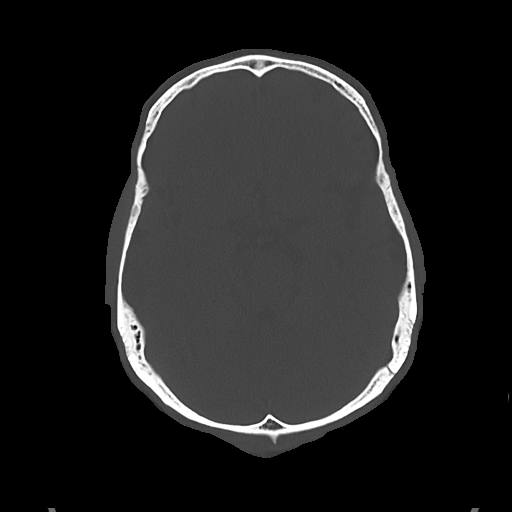

[Series 5: cor soft · coronal · 0.33mm/px · 3 of 73 slices shown]
[im 25/73  brain]
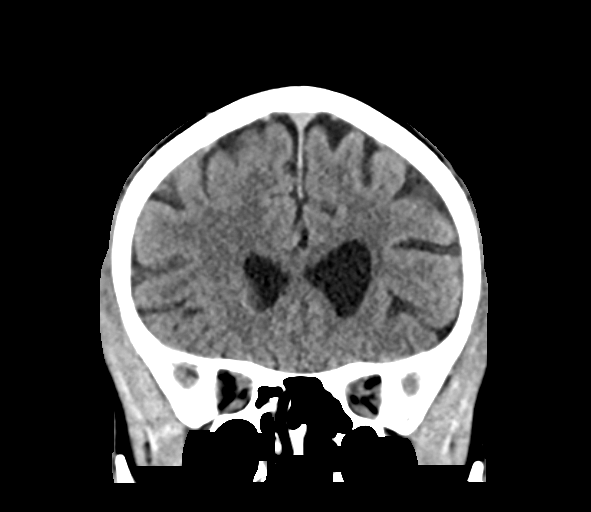
[im 33/73  brain]
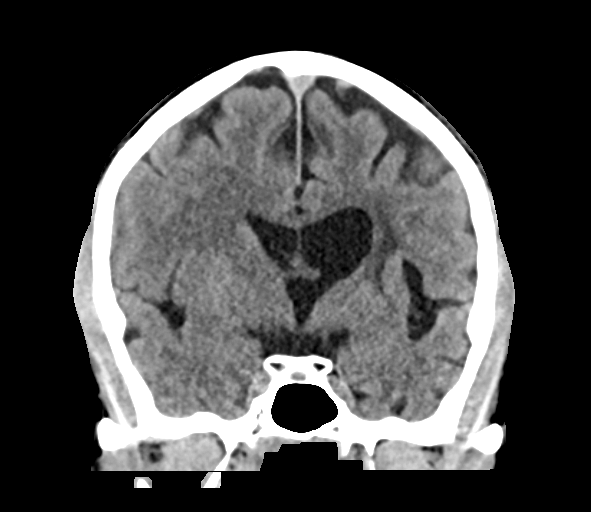
[im 41/73  brain]
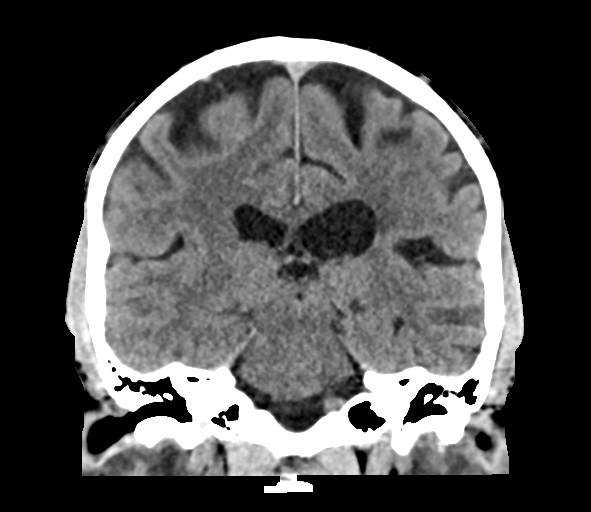

[Series 6: sag soft · sagittal · 0.33mm/px · 3 of 67 slices shown]
[im 23/67  brain]
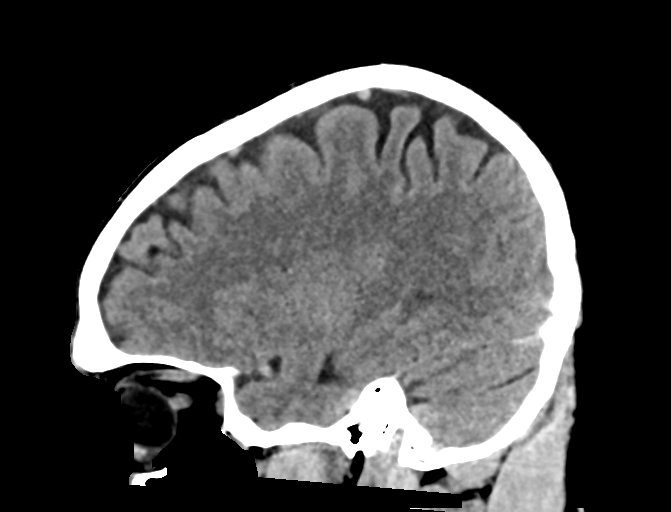
[im 34/67  brain]
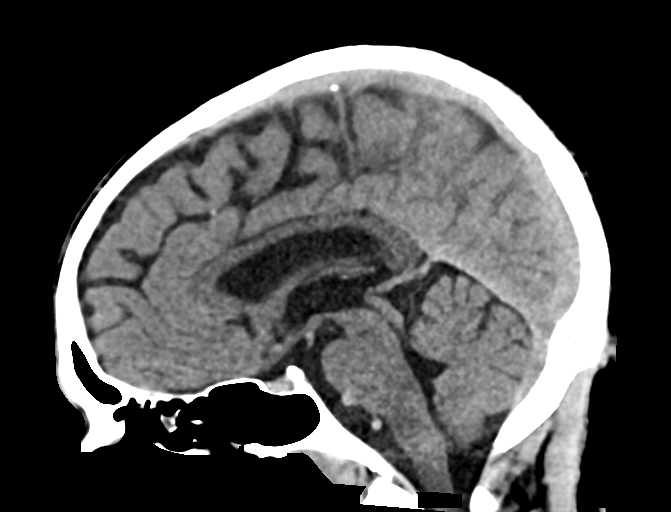
[im 45/67  brain]
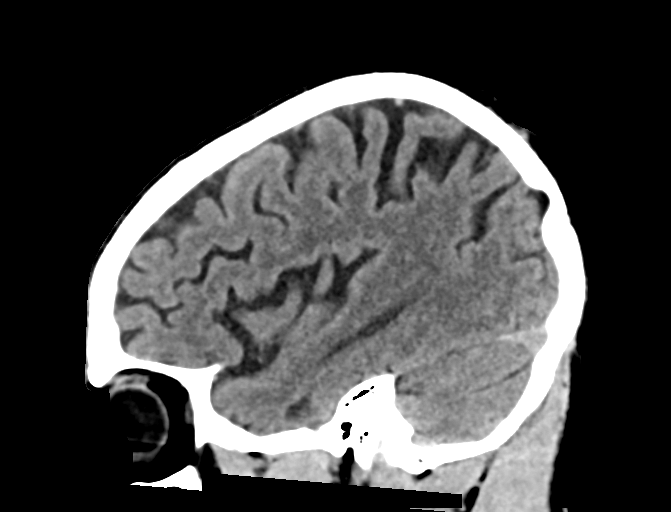

[16 of 47 positions shown; findings below may reference images not displayed]

FINDINGS: Brain: Mild atrophy. Asymmetric lateral ventricles with ex vacuo
enlargement of the LEFT lateral ventricle versus RIGHT. No midline
shift or mass effect. Old LEFT basal ganglia and external capsule
infarcts. No intracranial hemorrhage, mass lesion, or evidence of
acute infarction. No extra-axial fluid collections.

Vascular: No hyperdense vessels.

Skull: Intact

Sinuses/Orbits: Clear

Other: N/A
IMPRESSION: Old LEFT basal ganglia and LEFT external capsule infarcts.

No acute intracranial abnormalities.

## 2019-10-08 ENCOUNTER — Emergency Department (HOSPITAL_COMMUNITY)
Admission: EM | Admit: 2019-10-08 | Discharge: 2019-10-08 | Disposition: A | Discharge status: Home / Self Care | Payer: Medicaid Other | Attending: Emergency Medicine | Admitting: Emergency Medicine

## 2019-10-08 ENCOUNTER — Encounter (HOSPITAL_COMMUNITY): Payer: Self-pay

## 2019-10-08 ENCOUNTER — Emergency Department (HOSPITAL_COMMUNITY): Payer: Medicaid Other

## 2019-10-08 DIAGNOSIS — F17290 Nicotine dependence, other tobacco product, uncomplicated: Secondary | ICD-10-CM | POA: Diagnosis not present

## 2019-10-08 DIAGNOSIS — I10 Essential (primary) hypertension: Secondary | ICD-10-CM | POA: Insufficient documentation

## 2019-10-08 DIAGNOSIS — Z20828 Contact with and (suspected) exposure to other viral communicable diseases: Secondary | ICD-10-CM | POA: Insufficient documentation

## 2019-10-08 DIAGNOSIS — J029 Acute pharyngitis, unspecified: Secondary | ICD-10-CM | POA: Diagnosis present

## 2019-10-08 DIAGNOSIS — E11649 Type 2 diabetes mellitus with hypoglycemia without coma: Secondary | ICD-10-CM | POA: Insufficient documentation

## 2019-10-08 DIAGNOSIS — Z8673 Personal history of transient ischemic attack (TIA), and cerebral infarction without residual deficits: Secondary | ICD-10-CM | POA: Insufficient documentation

## 2019-10-08 DIAGNOSIS — Z7984 Long term (current) use of oral hypoglycemic drugs: Secondary | ICD-10-CM | POA: Insufficient documentation

## 2019-10-08 DIAGNOSIS — K122 Cellulitis and abscess of mouth: Secondary | ICD-10-CM

## 2019-10-08 DIAGNOSIS — Z79899 Other long term (current) drug therapy: Secondary | ICD-10-CM | POA: Diagnosis not present

## 2019-10-08 DIAGNOSIS — E162 Hypoglycemia, unspecified: Secondary | ICD-10-CM

## 2019-10-08 DIAGNOSIS — N289 Disorder of kidney and ureter, unspecified: Secondary | ICD-10-CM

## 2019-10-08 LAB — CBC WITH DIFFERENTIAL/PLATELET
Abs Immature Granulocytes: 0.02 10*3/uL (ref 0.00–0.07)
Basophils Absolute: 0 10*3/uL (ref 0.0–0.1)
Basophils Relative: 0 %
Eosinophils Absolute: 0.1 10*3/uL (ref 0.0–0.5)
Eosinophils Relative: 1 %
HCT: 35.5 % — ABNORMAL LOW (ref 39.0–52.0)
Hemoglobin: 12.3 g/dL — ABNORMAL LOW (ref 13.0–17.0)
Immature Granulocytes: 0 %
Lymphocytes Relative: 31 %
Lymphs Abs: 2.4 10*3/uL (ref 0.7–4.0)
MCH: 31.1 pg (ref 26.0–34.0)
MCHC: 34.6 g/dL (ref 30.0–36.0)
MCV: 89.9 fL (ref 80.0–100.0)
Monocytes Absolute: 0.5 10*3/uL (ref 0.1–1.0)
Monocytes Relative: 6 %
Neutro Abs: 4.6 10*3/uL (ref 1.7–7.7)
Neutrophils Relative %: 62 %
Platelets: 256 10*3/uL (ref 150–400)
RBC: 3.95 MIL/uL — ABNORMAL LOW (ref 4.22–5.81)
RDW: 13.3 % (ref 11.5–15.5)
WBC: 7.6 10*3/uL (ref 4.0–10.5)
nRBC: 0 % (ref 0.0–0.2)

## 2019-10-08 LAB — BASIC METABOLIC PANEL
Anion gap: 13 (ref 5–15)
BUN: 30 mg/dL — ABNORMAL HIGH (ref 6–20)
CO2: 21 mmol/L — ABNORMAL LOW (ref 22–32)
Calcium: 9 mg/dL (ref 8.9–10.3)
Chloride: 108 mmol/L (ref 98–111)
Creatinine, Ser: 2.88 mg/dL — ABNORMAL HIGH (ref 0.61–1.24)
GFR calc Af Amer: 28 mL/min — ABNORMAL LOW (ref >60)
GFR calc non Af Amer: 24 mL/min — ABNORMAL LOW (ref >60)
Glucose, Bld: 43 mg/dL — CL (ref 70–99)
Potassium: 3.2 mmol/L — ABNORMAL LOW (ref 3.5–5.1)
Sodium: 142 mmol/L (ref 135–145)

## 2019-10-08 LAB — GROUP A STREP BY PCR: Group A Strep by PCR: NOT DETECTED

## 2019-10-08 LAB — CBG MONITORING, ED
Glucose-Capillary: 122 mg/dL — ABNORMAL HIGH (ref 70–99)
Glucose-Capillary: 27 mg/dL — CL (ref 70–99)

## 2019-10-08 LAB — SARS CORONAVIRUS 2 (TAT 6-24 HRS): SARS Coronavirus 2: NEGATIVE

## 2019-10-08 MED ORDER — PREDNISONE 20 MG PO TABS
60.0000 mg | ORAL_TABLET | Freq: Once | ORAL | Status: AC
  Administered 2019-10-08: 05:00:00 60 mg via ORAL
  Filled 2019-10-08: qty 3

## 2019-10-08 MED ORDER — PREDNISONE 50 MG PO TABS: ORAL_TABLET | ORAL | 0 refills | Status: DC

## 2019-10-08 MED ORDER — GLUCOSE 4 G PO CHEW
1.0000 | CHEWABLE_TABLET | Freq: Once | ORAL | Status: AC
Start: 2019-10-08 — End: 2019-10-08
  Administered 2019-10-08: 07:00:00 1 via ORAL

## 2019-10-08 MED ORDER — DIPHENHYDRAMINE HCL 25 MG PO CAPS
25.0000 mg | ORAL_CAPSULE | Freq: Once | ORAL | Status: AC
  Administered 2019-10-08: 05:00:00 25 mg via ORAL
  Filled 2019-10-08: qty 1

## 2019-10-08 NOTE — ED Notes (Signed)
CBG: 27, Dr. Christy Gentles aware. RN to give glucose tablets. Pt drinking apple juice.

## 2019-10-08 NOTE — ED Provider Notes (Signed)
[facility] EMERGENCY DEPARTMENT Provider Note   CSN: [id number] Arrival date & time: 10/08/19  0319     History   Chief Complaint Chief Complaint  Patient presents with  . Sore Throat    HPI Keith [name] is a 48 y.o. male.     The history is provided by the patient.  Sore Throat This is a new problem. The current episode started yesterday. The problem occurs constantly. The problem has been gradually worsening. Pertinent negatives include no chest pain and no shortness of breath. The symptoms are aggravated by swallowing. Nothing relieves the symptoms.  Patient history of diabetes/hypertension presents with sore throat.  He reports over the past day he has had increasing pain with swallowing as well as difficulty swallowing.  Reports it feels swollen in his throat. No fever/vomiting/cough. No known Covid exposures. Denies any recent oral sex He does take lisinopril for HTN, not a new medication   Past Medical History:  Diagnosis Date  . Diabetes mellitus   . Hypercholesteremia   . Hypertension   . Stroke [facility])     Patient Active Problem List   Diagnosis Date Noted  . Diabetic hyperosmolar non-ketotic state ([place]) 03/10/2012    Past Surgical History:  Procedure Laterality Date  . IRRIGATION AND DEBRIDEMENT ABSCESS N/A 06/21/2015   Procedure: IRRIGATION AND DEBRIDEMENT ABSCESS;  Surgeon: Rolm Bookbinder, MD;  Location: [facility];  Service: General;  Laterality: N/A;        Home Medications    Prior to Admission medications   Medication Sig Start Date End Date Taking? Authorizing Provider  amLODipine (NORVASC) 5 MG tablet Take 5 mg by mouth 2 (two) times daily.     [provider]  aspirin 325 MG tablet Take 325 mg by mouth daily.    [provider]  glipiZIDE (GLUCOTROL XL) 5 MG 24 hr tablet Take 5 mg by mouth daily with breakfast.    [provider]  LANTUS SOLOSTAR 100 UNIT/ML Solostar Pen INJECT 30 UNITS  SUBCUTANEOUSLY IN THE MORNING 04/29/18   [provider]  lisinopril-hydrochlorothiazide (PRINZIDE,ZESTORETIC) 10-12.5 MG per tablet Take 1 tablet by mouth daily.    [provider]  metFORMIN (GLUCOPHAGE) 500 MG tablet Take 500 mg by mouth 2 (two) times daily with a meal.    [provider]  metoprolol (LOPRESSOR) 50 MG tablet Take 50 mg by mouth 2 (two) times daily.    [provider]  pravastatin (PRAVACHOL) 40 MG tablet Take 40 mg by mouth at bedtime.    [provider]  sertraline (ZOLOFT) 50 MG tablet Take 50 mg by mouth daily.    [provider]    Family History No family history on file.  Social History Social History   Tobacco Use  . Smoking status: Current Every Day Smoker    Types: Cigars  . Smokeless tobacco: Never Used  . Tobacco comment: Black & Milds 1 a day  Substance Use Topics  . Alcohol use: Yes  . Drug use: Yes    Types: Marijuana    Comment: occasional     Allergies   Patient has no known allergies.   Review of Systems Review of Systems  HENT: Positive for sore throat and trouble swallowing. Negative for drooling and facial swelling.   Respiratory: Negative for cough and shortness of breath.   Cardiovascular: Negative for chest pain.  All other systems reviewed and are negative.    Physical Exam Updated Vital Signs BP Marland Kitchen)  155/104 (BP Location: Right Arm)   Pulse 87   Temp 98.7 F (37.1 C) (Oral)   Resp 16   Ht 1.702 m (5\' 7" )   Wt 68 kg   SpO2 100%   BMI 23.49 kg/m   Physical Exam CONSTITUTIONAL: Well developed/well nourished HEAD: Normocephalic/atraumatic EYES: EOMI ENMT: Mucous membranes moist, no drooling, no stridor Uvula is edematous.  Diffuse erythema noted.  No exudates. NECK: supple no meningeal signs, no cervical lymphadenopathy SPINE/BACK:entire spine nontender CV: S1/S2 noted, no murmurs/rubs/gallops noted LUNGS: Lungs are clear to auscultation bilaterally, no apparent  distress ABDOMEN: soft, nontender NEURO: Pt is awake/alert/appropriate, moves all extremitiesx4.  No facial droop.   EXTREMITIES:  full ROM SKIN: warm, color normal PSYCH: no abnormalities of mood noted, alert and oriented to situation   ED Treatments / Results  Labs (all labs ordered are listed, but only abnormal results are displayed) Labs Reviewed  BASIC METABOLIC PANEL - Abnormal; Notable for the following components:      Result Value   Potassium 3.2 (*)    CO2 21 (*)    Glucose, Bld 43 (*)    BUN 30 (*)    Creatinine, Ser 2.88 (*)    GFR calc non Af Amer 24 (*)    GFR calc Af Amer 28 (*)    All other components within normal limits  CBC WITH DIFFERENTIAL/PLATELET - Abnormal; Notable for the following components:   RBC 3.95 (*)    Hemoglobin 12.3 (*)    HCT 35.5 (*)    All other components within normal limits  CBG MONITORING, ED - Abnormal; Notable for the following components:   Glucose-Capillary 27 (*)    All other components within normal limits  CBG MONITORING, ED - Abnormal; Notable for the following components:   Glucose-Capillary 122 (*)    All other components within normal limits  GROUP A STREP BY PCR  SARS CORONAVIRUS 2 (TAT 6-24 HRS)    EKG None  Radiology Dg Neck Soft Tissue  Result Date: 10/08/2019 CLINICAL DATA:  Throat pain all day.  Painful swallowing. EXAM: NECK SOFT TISSUES - 1+ VIEW COMPARISON:  None. FINDINGS: There is no evidence of retropharyngeal soft tissue swelling and no suspected epiglottic enlargement. These structures have a stable appearance when compared to CT scanogram February 22, 2012. The cervical airway is widely patent with no visible opaque foreign body. Chronic thickening of the palatine tonsils which overlaps the lingual tonsils and the vallecula. IMPRESSION: Stable.  No acute finding. Electronically Signed   By: Monte Fantasia M.D.   On: 10/08/2019 04:56    Procedures Procedures   Medications Ordered in ED Medications   predniSONE (DELTASONE) tablet 60 mg (60 mg Oral Given 10/08/19 0515)  diphenhydrAMINE (BENADRYL) capsule 25 mg (25 mg Oral Given 10/08/19 0516)  glucose chewable tablet 4 g (1 tablet Oral Given 10/08/19 0645)     Initial Impression / Assessment and Plan / ED Course  I have reviewed the triage vital signs and the nursing notes.  Pertinent labs & imaging results that were available during my care of the patient were reviewed by me and considered in my medical decision making (see chart for details).        4:38 AM Patient presents with uvulitis of unclear etiology.  He is currently protecting his airway. X-ray and labs are pending at this time Pt does take an ACE inhibitor but not a new medication 7:17 AM For his uvulitis, this could be related to ACE  inhibitor.  Low suspicion for infectious etiology Plan to stop lisinopril, and follow-up with his cardiologist next week for blood pressure management.  He will be continued on prednisone for the next 5 days No other signs of oral swelling.  No signs of airway compromise.  Soft tissue neck shows a wide open airway We discussed strict return precautions  Patient found to have incidental hypoglycemia.  He reports he did take his medicines yesterday morning.  This rebounded quickly with oral juice and crackers.  He feels at baseline.  He is in no acute distress.  He was advised to hold all of his diabetic medications for the next 24 hours, and to restart them slowly.  He will need to recheck his glucose frequently.  Keith [name] was evaluated in Emergency Department on 10/08/2019 for the symptoms described in the history of present illness. He was evaluated in the context of the global COVID-19 pandemic, which necessitated consideration that the patient might be at risk for infection with the SARS-CoV-2 virus that causes COVID-19. Institutional protocols and algorithms that pertain to the evaluation of patients at risk for COVID-19 are in a  state of rapid change based on information released by regulatory bodies including the CDC and federal and state organizations. These policies and algorithms were followed during the patient's care in the ED.   Final Clinical Impressions(s) / ED Diagnoses   Final diagnoses:  Uvulitis  Hypoglycemia  Renal insufficiency    ED Discharge Orders         Ordered    predniSONE (DELTASONE) 50 MG tablet     10/08/19 0640    POCT CBG (Fasting - Glucose)     10/08/19 [id number]           Ripley Fraise, MD 10/08/19 [phone]

## 2019-10-08 NOTE — ED Notes (Signed)
Discharge instructions discussed with pt. Pt verbalized understanding. Pt stable and ambulatory. No signature pad available. 

## 2019-10-08 NOTE — Discharge Instructions (Signed)
Please stop your lisinopril/HCTZ as this could be causing her swelling Take the steroids every day to help with swelling Please follow-up with Dr. Doylene Canard in 1 week for blood pressure management and any new medications Be sure to check your blood sugars frequently since it dropped very low here in the ER You should also hold your Glucotrol/glipizide for the next 3 days and check your blood sugar frequently

## 2019-10-08 NOTE — ED Triage Notes (Signed)
Pt states that his throat has been hurting all day, hurts to swallow

## 2020-02-13 ENCOUNTER — Other Ambulatory Visit: Payer: Self-pay

## 2020-02-13 ENCOUNTER — Ambulatory Visit (INDEPENDENT_AMBULATORY_CARE_PROVIDER_SITE_OTHER): Payer: Medicaid Other | Admitting: Podiatry

## 2020-02-13 ENCOUNTER — Encounter: Payer: Self-pay | Admitting: Podiatry

## 2020-02-13 VITALS — Temp 98.1°F

## 2020-02-13 DIAGNOSIS — M2042 Other hammer toe(s) (acquired), left foot: Secondary | ICD-10-CM | POA: Diagnosis not present

## 2020-02-13 DIAGNOSIS — B351 Tinea unguium: Secondary | ICD-10-CM | POA: Diagnosis not present

## 2020-02-13 DIAGNOSIS — M2041 Other hammer toe(s) (acquired), right foot: Secondary | ICD-10-CM

## 2020-02-13 DIAGNOSIS — M79675 Pain in left toe(s): Secondary | ICD-10-CM

## 2020-02-13 DIAGNOSIS — M79674 Pain in right toe(s): Secondary | ICD-10-CM | POA: Diagnosis not present

## 2020-02-13 DIAGNOSIS — L84 Corns and callosities: Secondary | ICD-10-CM

## 2020-02-13 DIAGNOSIS — E119 Type 2 diabetes mellitus without complications: Secondary | ICD-10-CM

## 2020-02-13 DIAGNOSIS — E1100 Type 2 diabetes mellitus with hyperosmolarity without nonketotic hyperglycemic-hyperosmolar coma (NKHHC): Secondary | ICD-10-CM

## 2020-02-13 NOTE — Patient Instructions (Addendum)
Diabetes Mellitus and Foot Care Foot care is an important part of your health, especially when you have diabetes. Diabetes may cause you to have problems because of poor blood flow (circulation) to your feet and legs, which can cause your skin to:  Become thinner and drier.  Break more easily.  Heal more slowly.  Peel and crack. You may also have nerve damage (neuropathy) in your legs and feet, causing decreased feeling in them. This means that you may not notice minor injuries to your feet that could lead to more serious problems. Noticing and addressing any potential problems early is the best way to prevent future foot problems. How to care for your feet Foot hygiene  Wash your feet daily with warm water and mild soap. Do not use hot water. Then, pat your feet and the areas between your toes until they are completely dry. Do not soak your feet as this can dry your skin.  Trim your toenails straight across. Do not dig under them or around the cuticle. File the edges of your nails with an emery board or nail file.  Apply a moisturizing lotion or petroleum jelly to the skin on your feet and to dry, brittle toenails. Use lotion that does not contain alcohol and is unscented. Do not apply lotion between your toes. Shoes and socks  Wear clean socks or stockings every day. Make sure they are not too tight. Do not wear knee-high stockings since they may decrease blood flow to your legs.  Wear shoes that fit properly and have enough cushioning. Always look in your shoes before you put them on to be sure there are no objects inside.  To break in new shoes, wear them for just a few hours a day. This prevents injuries on your feet. Wounds, scrapes, corns, and calluses  Check your feet daily for blisters, cuts, bruises, sores, and redness. If you cannot see the bottom of your feet, use a mirror or ask someone for help.  Do not cut corns or calluses or try to remove them with medicine.  If you  find a minor scrape, cut, or break in the skin on your feet, keep it and the skin around it clean and dry. You may clean these areas with mild soap and water. Do not clean the area with peroxide, alcohol, or iodine.  If you have a wound, scrape, corn, or callus on your foot, look at it several times a day to make sure it is healing and not infected. Check for: ? Redness, swelling, or pain. ? Fluid or blood. ? Warmth. ? Pus or a bad smell. General instructions  Do not cross your legs. This may decrease blood flow to your feet.  Do not use heating pads or hot water bottles on your feet. They may burn your skin. If you have lost feeling in your feet or legs, you may not know this is happening until it is too late.  Protect your feet from hot and cold by wearing shoes, such as at the beach or on hot pavement.  Schedule a complete foot exam at least once a year (annually) or more often if you have foot problems. If you have foot problems, report any cuts, sores, or bruises to your health care provider immediately. Contact a health care provider if:  You have a medical condition that increases your risk of infection and you have any cuts, sores, or bruises on your feet.  You have an injury that is not   healing.  You have redness on your legs or feet.  You feel burning or tingling in your legs or feet.  You have pain or cramps in your legs and feet.  Your legs or feet are numb.  Your feet always feel cold.  You have pain around a toenail. Get help right away if:  You have a wound, scrape, corn, or callus on your foot and: ? You have pain, swelling, or redness that gets worse. ? You have fluid or blood coming from the wound, scrape, corn, or callus. ? Your wound, scrape, corn, or callus feels warm to the touch. ? You have pus or a bad smell coming from the wound, scrape, corn, or callus. ? You have a fever. ? You have a red line going up your leg. Summary  Check your feet every day  for cuts, sores, red spots, swelling, and blisters.  Moisturize feet and legs daily.  Wear shoes that fit properly and have enough cushioning.  If you have foot problems, report any cuts, sores, or bruises to your health care provider immediately.  Schedule a complete foot exam at least once a year (annually) or more often if you have foot problems. This information is not intended to replace advice given to you by your health care provider. Make sure you discuss any questions you have with your health care provider. Document Revised: 07/30/2019 Document Reviewed: 12/08/2016 Elsevier Patient Education  [place] Toe  Hammer toe is a change in the shape (a deformity) of your toe. The deformity causes the middle joint of your toe to stay bent. This causes pain, especially when you are wearing shoes. Hammer toe starts gradually. At first, the toe can be straightened. Gradually over time, the deformity becomes stiff and permanent. Early treatments to keep the toe straight may relieve pain. As the deformity becomes stiff and permanent, surgery may be needed to straighten the toe. What are the causes? Hammer toe is caused by abnormal bending of the toe joint that is closest to your foot. It happens gradually over time. This pulls on the muscles and connections (tendons) of the toe joint, making them weak and stiff. It is often related to wearing shoes that are too short or narrow and do not let your toes straighten. What increases the risk? You may be at greater risk for hammer toe if you:  Are male.  Are older.  Wear shoes that are too small.  Wear high-heeled shoes that pinch your toes.  Are a Engineer, mining.  Have a second toe that is longer than your big toe (first toe).  Injure your foot or toe.  Have arthritis.  Have a family history of hammer toe.  Have a nerve or muscle disorder. What are the signs or symptoms? The main symptoms of this condition are  pain and deformity of the toe. The pain is worse when wearing shoes, walking, or running. Other symptoms may include:  Corns or calluses over the bent part of the toe or between the toes.  Redness and a burning feeling on the toe.  An open sore that forms on the top of the toe.  Not being able to straighten the toe. How is this diagnosed? This condition is diagnosed based on your symptoms and a physical exam. During the exam, your health care provider will try to straighten your toe to see how stiff the deformity is. You may also have tests, such as:  A blood test  to check for rheumatoid arthritis.  An X-ray to show how severe the deformity is. How is this treated? Treatment for this condition will depend on how stiff the deformity is. Surgery is often needed. However, sometimes a hammer toe can be straightened without surgery. Treatments that do not involve surgery include:  Taping the toe into a straightened position.  Using pads and cushions to protect the toe (orthotics).  Wearing shoes that provide enough room for the toes.  Doing toe-stretching exercises at home.  Taking an NSAID to reduce pain and swelling. If these treatments do not help or the toe cannot be straightened, surgery is the next option. The most common surgeries used to straighten a hammer toe include:  Arthroplasty. In this procedure, part of the joint is removed, and that allows the toe to straighten.  Fusion. In this procedure, cartilage between the two bones of the joint is taken out and the bones are fused together into one longer bone.  Implantation. In this procedure, part of the bone is removed and replaced with an implant to let the toe move again.  Flexor tendon transfer. In this procedure, the tendons that curl the toes down (flexor tendons) are repositioned. Follow these instructions at home:  Take over-the-counter and prescription medicines only as told by your health care provider.  Do toe  straightening and stretching exercises as told by your health care provider.  Keep all follow-up visits as told by your health care provider. This is important. How is this prevented?  Wear shoes that give your toes enough room and do not cause pain.  Do not wear high-heeled shoes. Contact a health care provider if:  Your pain gets worse.  Your toe becomes red or swollen.  You develop an open sore on your toe. This information is not intended to replace advice given to you by your health care provider. Make sure you discuss any questions you have with your health care provider. Document Revised: 10/19/2017 Document Reviewed: 03/01/2016 Elsevier Patient Education  [place] are small areas of thickened skin that occur on the top, sides, or tip of a toe. They contain a cone-shaped core with a point that can press on a nerve below. This causes pain.  Calluses are areas of thickened skin that can occur anywhere on the body, including the hands, fingers, palms, soles of the feet, and heels. Calluses are usually larger than corns. What are the causes? Corns and calluses are caused by rubbing (friction) or pressure, such as from shoes that are too tight or do not fit properly. What increases the risk? Corns are more likely to develop in people who have misshapen toes (toe deformities), such as hammer toes. Calluses can occur with friction to any area of the skin. They are more likely to develop in people who:  Work with their hands.  Wear shoes that fit poorly, are too tight, or are high-heeled.  Have toe deformities. What are the signs or symptoms? Symptoms of a corn or callus include:  A hard growth on the skin.  Pain or tenderness under the skin.  Redness and swelling.  Increased discomfort while wearing tight-fitting shoes, if your feet are affected. If a corn or callus becomes infected, symptoms may include:  Redness and swelling that  gets worse.  Pain.  Fluid, blood, or pus draining from the corn or callus. How is this diagnosed? Corns and calluses may be diagnosed based on your symptoms, your  medical history, and a physical exam. How is this treated? Treatment for corns and calluses may include:  Removing the cause of the friction or pressure. This may involve: ? Changing your shoes. ? Wearing shoe inserts (orthotics) or other protective layers in your shoes, such as a corn pad. ? Wearing gloves.  Applying medicine to the skin (topical medicine) to help soften skin in the hardened, thickened areas.  Removing layers of dead skin with a file to reduce the size of the corn or callus.  Removing the corn or callus with a scalpel or laser.  Taking antibiotic medicines, if your corn or callus is infected.  Having surgery, if a toe deformity is the cause. Follow these instructions at home:   Take over-the-counter and prescription medicines only as told by your health care provider.  If you were prescribed an antibiotic, take it as told by your health care provider. Do not stop taking it even if your condition starts to improve.  Wear shoes that fit well. Avoid wearing high-heeled shoes and shoes that are too tight or too loose.  Wear any padding, protective layers, gloves, or orthotics as told by your health care provider.  Soak your hands or feet and then use a file or pumice stone to soften your corn or callus. Do this as told by your health care provider.  Check your corn or callus every day for symptoms of infection. Contact a health care provider if you:  Notice that your symptoms do not improve with treatment.  Have redness or swelling that gets worse.  Notice that your corn or callus becomes painful.  Have fluid, blood, or pus coming from your corn or callus.  Have new symptoms. Summary  Corns are small areas of thickened skin that occur on the top, sides, or tip of a toe.  Calluses are areas  of thickened skin that can occur anywhere on the body, including the hands, fingers, palms, and soles of the feet. Calluses are usually larger than corns.  Corns and calluses are caused by rubbing (friction) or pressure, such as from shoes that are too tight or do not fit properly.  Treatment may include wearing any padding, protective layers, gloves, or orthotics as told by your health care provider. This information is not intended to replace advice given to you by your health care provider. Make sure you discuss any questions you have with your health care provider. Document Revised: 02/26/2019 Document Reviewed: 09/19/2017 Elsevier Patient Education  2020 [employer].

## 2020-02-13 NOTE — Progress Notes (Signed)
Subjective: Keith [name] presents today referred by Dixie Dials, MD for diabetic foot evaluation.  Patient relates 10-15 year history of diabetes.  Patient denies any history of foot wounds.  Patient denies any history of numbness, tingling, burning, pins/needles sensations.  Today, patient c/o of painful, discolored, thick toenails which interfere with daily activities.  Pain is aggravated when wearing enclosed shoe gear. Also complains of painful corn left 5th digit for "years". Corn is painful when wearing enclosed shoe gear. He has used a razor blade in the past to trim the corn.  Past Medical History:  Diagnosis Date  . Diabetes mellitus   . Hypercholesteremia   . Hypertension   . Stroke [facility])    Past Surgical History:  Procedure Laterality Date  . IRRIGATION AND DEBRIDEMENT ABSCESS N/A 06/21/2015   Procedure: IRRIGATION AND DEBRIDEMENT ABSCESS;  Surgeon: Rolm Bookbinder, MD;  Location: [place];  Service: General;  Laterality: N/A;   Patient Active Problem List   Diagnosis Date Noted  . 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 amLODipine (NORVASC) 5 MG tablet Take 5 mg by mouth 2 (two) times daily.     Marland Kitchen aspirin 325 MG tablet Take 325 mg by mouth daily.    . famotidine (PEPCID) 20 MG tablet Take 20 mg by mouth daily.    Marland Kitchen glipiZIDE (GLUCOTROL XL) 5 MG 24 hr tablet Take 5 mg by mouth daily with breakfast.    . hydrALAZINE (APRESOLINE) 25 MG tablet Take 25 mg by mouth 2 (two) times daily.    Marland Kitchen LANTUS SOLOSTAR 100 UNIT/ML Solostar Pen INJECT 30 UNITS SUBCUTANEOUSLY IN THE MORNING  6  . metFORMIN (GLUCOPHAGE) 500 MG tablet Take 500 mg by mouth 2 (two) times daily with a meal.    . sertraline (ZOLOFT) 50 MG tablet Take 50 mg by mouth daily.    . metoprolol (LOPRESSOR) 50 MG tablet Take 50 mg by mouth 2 (two) times daily.    . metoprolol succinate (TOPROL-XL) 25 MG 24 hr  tablet Take 25 mg by mouth daily.    . pravastatin (PRAVACHOL) 40 MG tablet Take 40 mg by mouth at bedtime.    . [DISCONTINUED] lisinopril-hydrochlorothiazide (PRINZIDE,ZESTORETIC) 10-12.5 MG per tablet Take 1 tablet by mouth daily.     No current facility-administered medications on file prior to visit.  No Known Allergies  Family History  Problem Relation Age of Onset  . Diabetes Mellitus II Mother   . Hypertension Mother   . Diabetes Mellitus II Father   . Hypertension Father   . Hypertension Sister   . Diabetes Sister    Review of systems: Positive Findings in bold print.  Constitutional:  chills, fatigue, fever, sweats, weight change Communication: Optometrist, sign Ecologist, hand writing, iPad/Android device Head: headaches, head injury Eyes: changes in vision, eye pain, glaucoma, cataracts, macular degeneration, diplopia, glare,  light sensitivity, eyeglasses or contacts, blindness Ears nose mouth throat: hearing impaired, hearing aids,  ringing in ears, deaf, sign language,  vertigo, nosebleeds,  rhinitis,  cold sores, snoring, swollen glands Cardiovascular: HTN, edema, arrhythmia, pacemaker in place, defibrillator in place, chest pain/tightness, chronic anticoagulation, blood clot, heart failure, MI Peripheral Vascular: leg cramps, varicose veins, blood clots, lymphedema, varicosities Respiratory:  difficulty breathing, denies congestion, SOB, wheezing, cough, emphysema Gastrointestinal: change in appetite or weight, abdominal pain, constipation, diarrhea, nausea, vomiting, vomiting blood, change in bowel habits, abdominal pain, jaundice, rectal bleeding,  hemorrhoids, GERD Genitourinary:  nocturia,  pain on urination, polyuria,  blood in urine, Foley catheter, urinary urgency, ESRD on hemodialysis Musculoskeletal: amputation, cramping, stiff joints, painful joints, decreased joint motion, fractures, OA, gout, hemiplegia, paraplegia, uses cane, wheelchair bound, uses  walker, uses rollator Skin: +changes in toenails, color change, dryness, itching, mole changes,  rash, wound(s) Neurological: headaches, numbness in feet, paresthesias in feet, burning in feet, fainting,  seizures, change in speech,  headaches, memory problems/poor historian, cerebral palsy, weakness, paralysis, CVA, TIA Endocrine: diabetes, hypothyroidism, hyperthyroidism,  goiter, dry mouth, flushing, heat intolerance,  cold intolerance,  excessive thirst, denies polyuria,  nocturia Hematological:  easy bleeding, excessive bleeding, easy bruising, enlarged lymph nodes, on long term blood thinner, history of past transusions Allergy/immunological:  hives, eczema, frequent infections, multiple drug allergies, seasonal allergies, transplant recipient, multiple food allergies Psychiatric:  anxiety, depression, mood disorder, suicidal ideations, hallucinations, insomnia  Objective: Vitals:   02/13/20 0826  Temp: 98.1 F (36.7 C)    49 y.o. AA male WD, WN IN NAD. AAO X 3.  Capillary refill time to digits immediate b/l. Palpable DP pulses b/l. Palpable PT pulses b/l. Pedal hair present b/l. Skin temperature gradient within normal limits b/l.  Pedal skin with normal turgor, texture and tone bilaterally. No open wounds bilaterally. No interdigital macerations bilaterally. Toenails 1-5 b/l elongated, dystrophic, thickened, crumbly with subungual debris and tenderness to dorsal palpation. Hyperkeratotic lesion(s) L 5th toe.  No erythema, no edema, no drainage, no flocculence.  Normal muscle strength 5/5 to all lower extremity muscle groups bilaterally, no pain crepitus or joint limitation noted with ROM b/l and hammertoes noted to the  L 5th toe.  Protective sensation intact 5/5 intact bilaterally with 10g monofilament b/l Vibratory sensation intact b/l Proprioception intact bilaterally.  1. Pain due to onychomycosis of toenails of both feet   2. Corns   3. Acquired hammertoes of both feet   4.  Encounter for diabetic foot exam ([place])   5. Diabetic hyperosmolar non-ketotic state ([place])     -Diabetic foot examination performed on today's visit. -Discussed diabetic foot care principles. Literature dispensed on today. -Toenails 1-5 b/l were debrided in length and girth with sterile nail nippers and dremel without iatrogenic bleeding.  -Corn(s) debrided L 5th toe without complication or incident. Total number debrided=1. -Patient to continue soft, supportive shoe gear daily. -Patient to report any pedal injuries to medical professional immediately. -Patient/POA to call should there be question/concern in the interim.  Return in about 3 months (around 05/15/2020).

## 2020-05-17 ENCOUNTER — Ambulatory Visit: Payer: Medicaid Other | Admitting: Podiatry

## 2020-06-30 ENCOUNTER — Other Ambulatory Visit: Payer: Self-pay | Admitting: Nephrology

## 2020-06-30 DIAGNOSIS — N184 Chronic kidney disease, stage 4 (severe): Secondary | ICD-10-CM

## 2020-07-06 ENCOUNTER — Ambulatory Visit
Admission: RE | Admit: 2020-07-06 | Discharge: 2020-07-06 | Disposition: A | Discharge status: Home / Self Care | Payer: Medicaid Other | Source: Ambulatory Visit | Attending: Nephrology | Admitting: Nephrology

## 2020-07-06 DIAGNOSIS — N184 Chronic kidney disease, stage 4 (severe): Secondary | ICD-10-CM

## 2020-10-18 ENCOUNTER — Other Ambulatory Visit: Payer: Self-pay | Admitting: General Surgery

## 2020-10-19 IMAGING — US US RENAL
1 series · 14 of 25 positions shown · non-contrast
Comparison: 12/30/2010

CLINICAL DATA: Chronic kidney disease

EXAM:
RENAL / URINARY TRACT ULTRASOUND COMPLETE

[Series 1: us renal · 0.19mm/px · 14 of 34 slices shown]
[im 1/34]
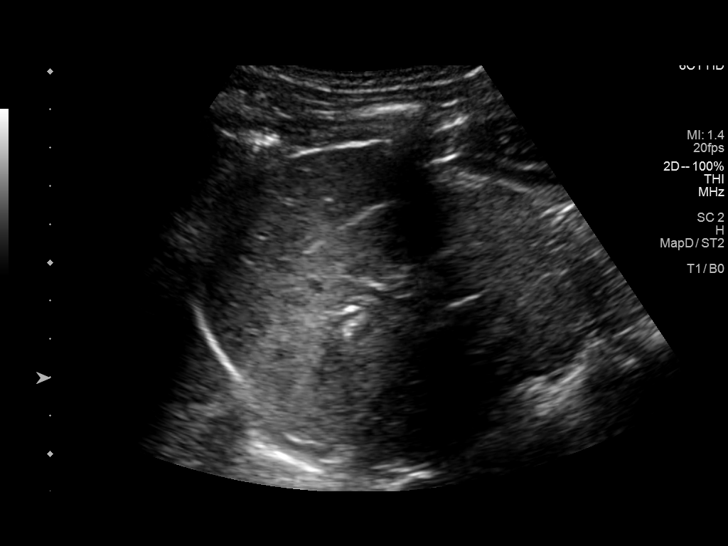
[im 3/34]
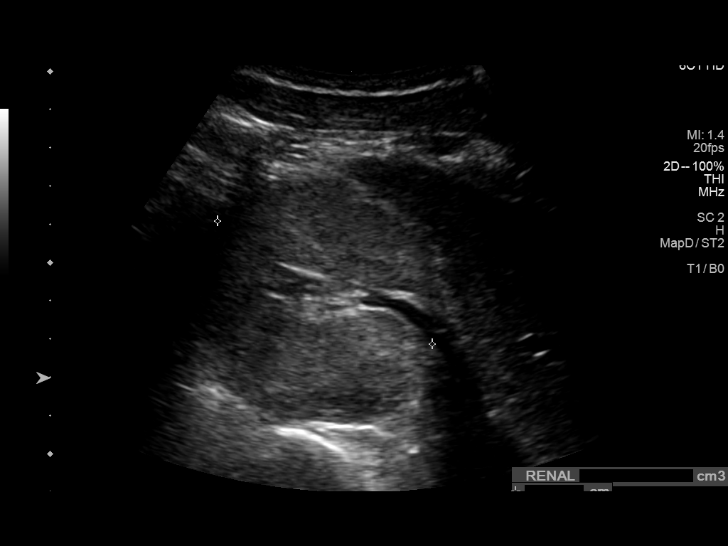
[im 6/34]
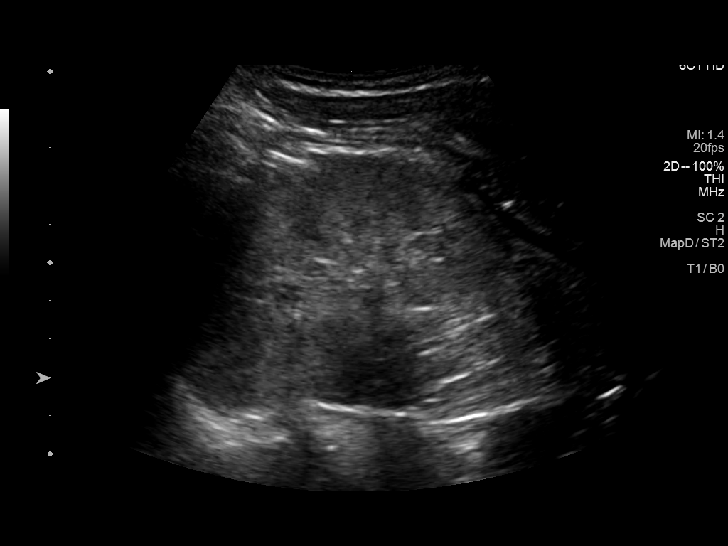
[im 9/34]
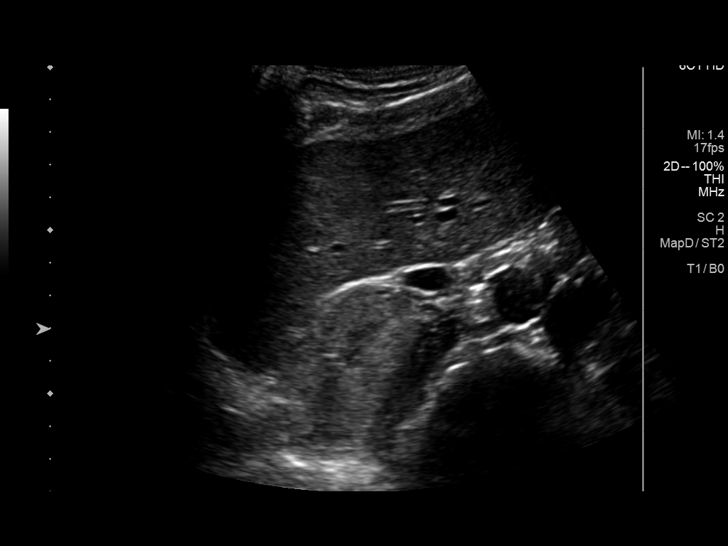
[im 12/34]
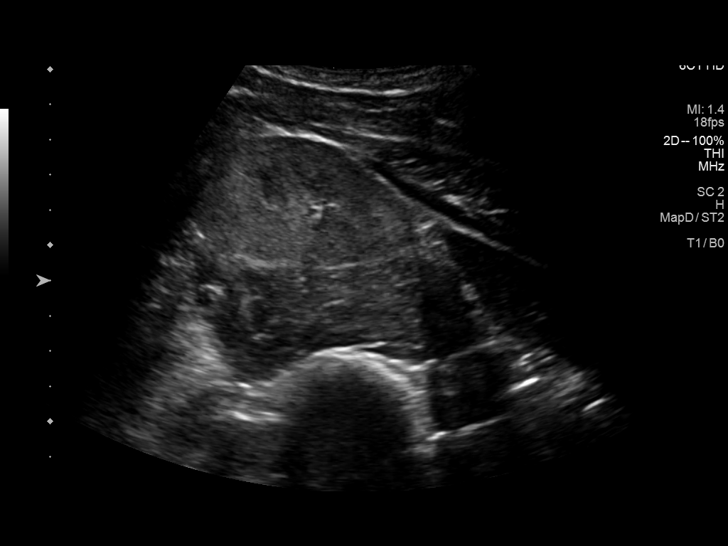
[im 13/34]
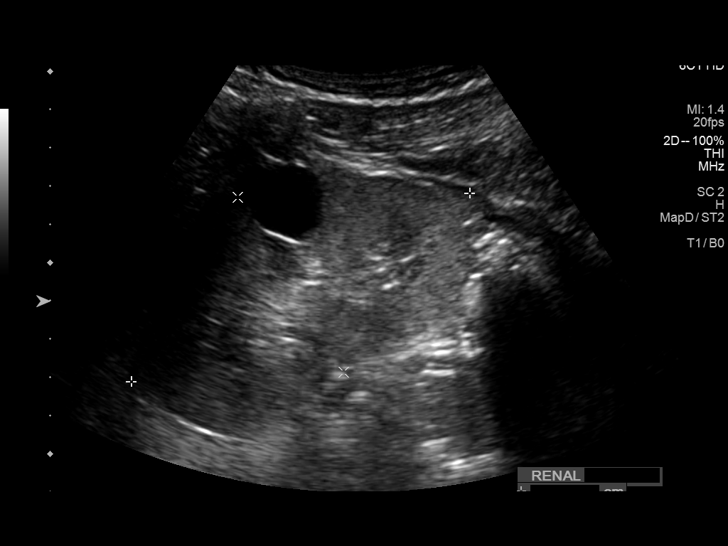
[im 16/34]
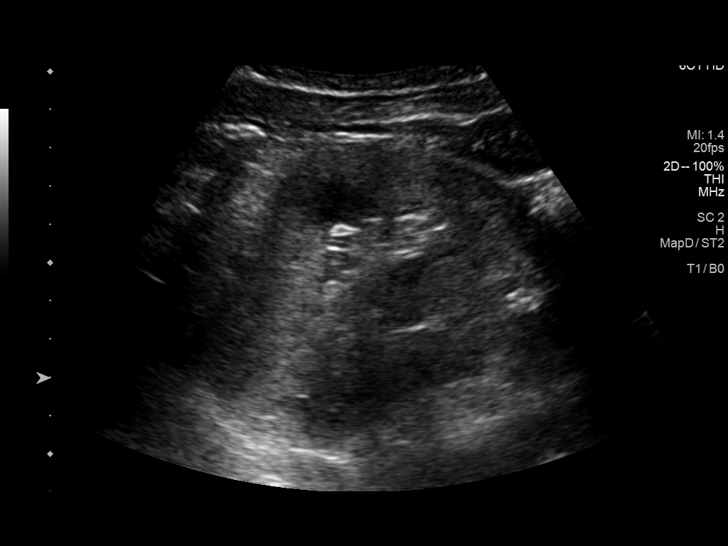
[im 18/34]
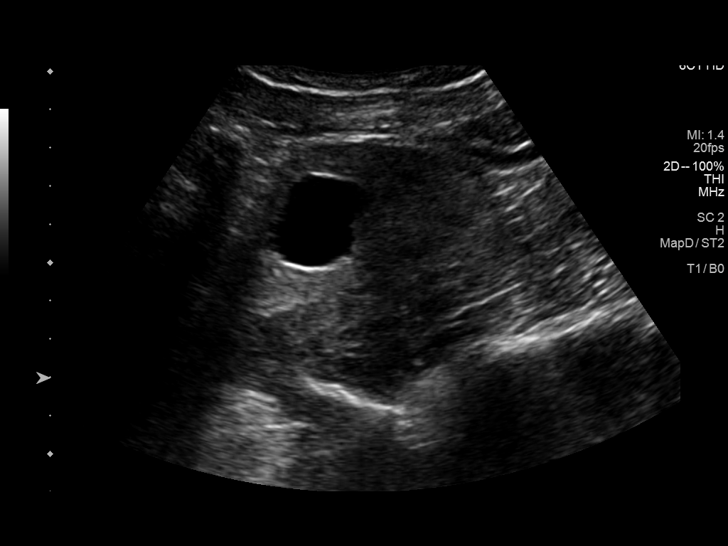
[im 21/34]
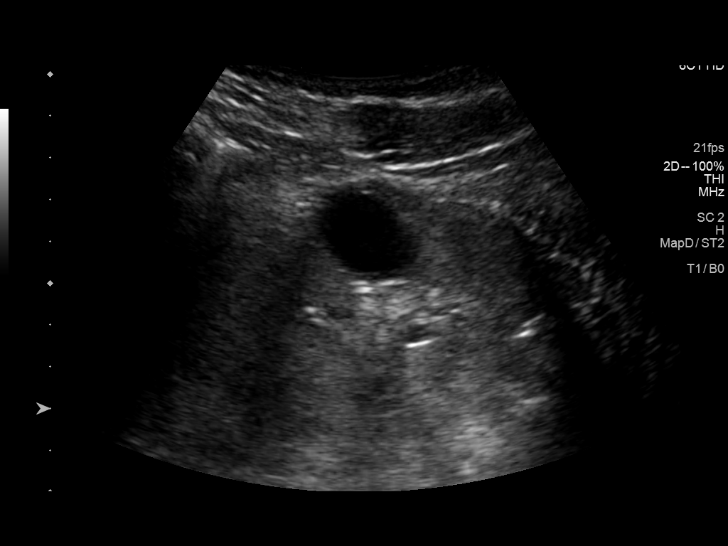
[im 23/34]
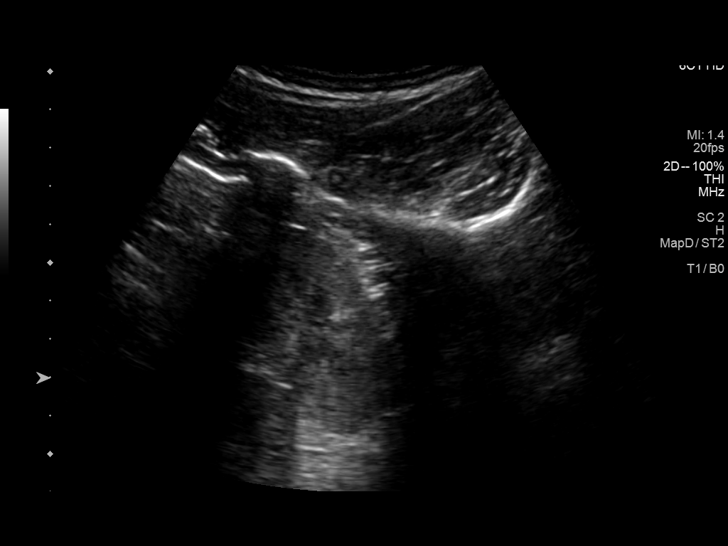
[im 25/34]
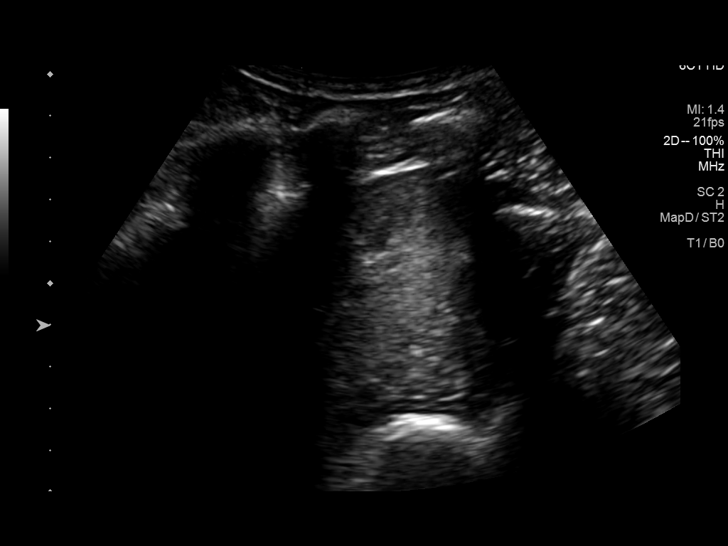
[im 28/34]
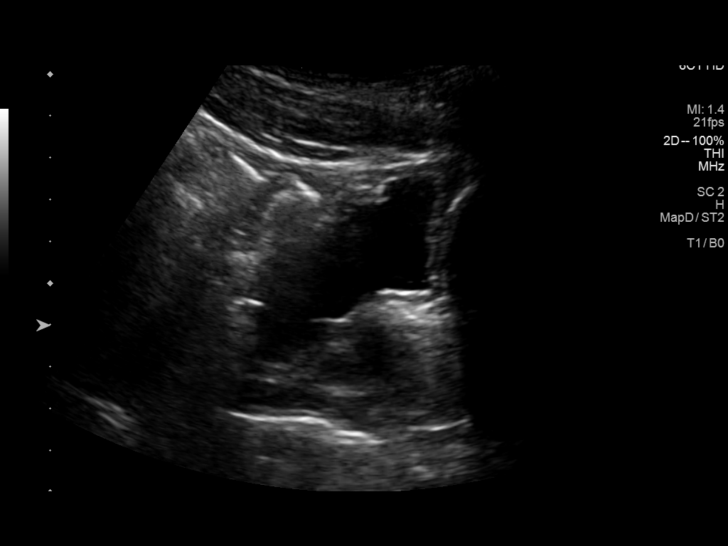
[im 31/34]
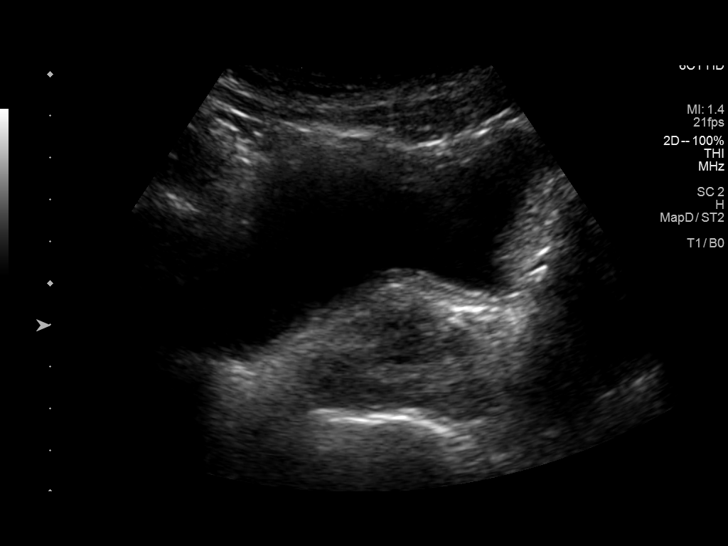
[im 34/34]
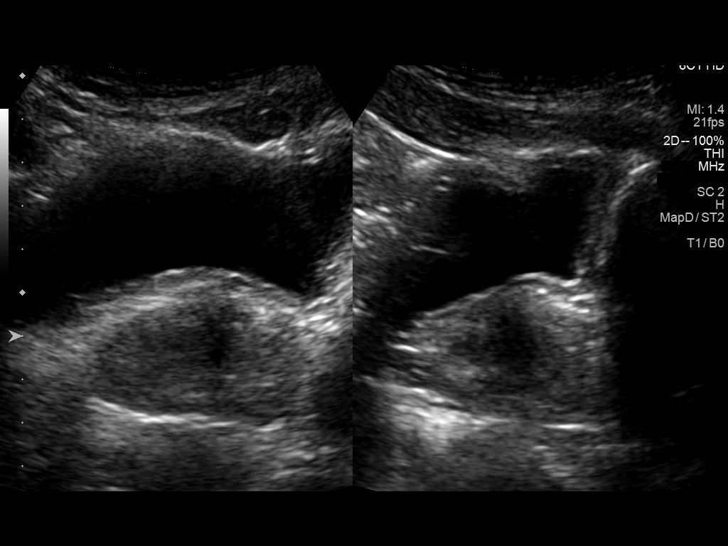

[14 of 25 positions shown; findings below may reference images not displayed]

FINDINGS: Right Kidney:

Renal measurements: 9.4 x 4.2 x 6.5 cm = volume: 135 mL. Increased
renal cortical echogenicity. No mass, shadowing stone, or
hydronephrosis visualized.

Left Kidney:

Renal measurements: 10.1 x 5.4 x 5.6 cm = volume: 158 mL. Increased
renal cortical echogenicity. Simple midpole cyst measuring up to
cm. No solid mass, shadowing stone, or hydronephrosis visualized.

Bladder:

Appears normal for degree of bladder distention.

Other:

Mildly enlarged prostate gland measuring approximately 0.0 x 3.6 x
4.2 cm, volume 39 mL.
IMPRESSION: 1. No evidence of obstructive uropathy.
2. Increased renal cortical echogenicity suggesting chronic medical
renal disease.
3. Mild prostatomegaly.

## 2021-01-28 ENCOUNTER — Encounter (HOSPITAL_COMMUNITY): Payer: Self-pay

## 2021-01-28 ENCOUNTER — Other Ambulatory Visit: Payer: Self-pay

## 2021-01-28 ENCOUNTER — Ambulatory Visit (HOSPITAL_COMMUNITY)
Admission: EM | Admit: 2021-01-28 | Discharge: 2021-01-28 | Disposition: A | Discharge status: Home / Self Care | Payer: Medicaid Other

## 2021-01-28 DIAGNOSIS — L02215 Cutaneous abscess of perineum: Secondary | ICD-10-CM

## 2021-01-28 MED ORDER — SULFAMETHOXAZOLE-TRIMETHOPRIM 800-160 MG PO TABS: 1.0000 | ORAL_TABLET | Freq: Two times a day (BID) | ORAL | 0 refills | Status: AC

## 2021-01-28 NOTE — Discharge Instructions (Addendum)
Follow-up in 1 week for a recheck of the area if not fully resolved

## 2021-01-28 NOTE — ED Provider Notes (Signed)
[name]    CSN: [id number] Arrival date & time: 01/28/21  1107      History   Chief Complaint Chief Complaint  Patient presents with  . Abscess    HPI Keith [name] is a 50 y.o. male.   Patient here today with 1 day history of firm small nodule that is very painful in the left groin area.  He states the area started out really small yesterday but now has gotten much larger today.  Denies redness, drainage, fever, chills, body aches, nausea, vomiting, injury to the area.  Denies any urinary symptoms today.  Not tried anything over-the-counter for symptoms.     Past Medical History:  Diagnosis Date  . Diabetes mellitus   . Hypercholesteremia   . Hypertension   . Stroke [facility])     Patient Active Problem List   Diagnosis Date Noted  . Diabetic hyperosmolar non-ketotic state ([place]) 03/10/2012    Past Surgical History:  Procedure Laterality Date  . IRRIGATION AND DEBRIDEMENT ABSCESS N/A 06/21/2015   Procedure: IRRIGATION AND DEBRIDEMENT ABSCESS;  Surgeon: Rolm Bookbinder, MD;  Location: [place];  Service: General;  Laterality: N/A;       Home Medications    Prior to Admission medications   Medication Sig Start Date End Date Taking? Authorizing Provider  sulfamethoxazole-trimethoprim (BACTRIM DS) 800-160 MG tablet Take 1 tablet by mouth 2 (two) times daily. 01/28/21  Yes Volney American, PA-C  amLODipine (NORVASC) 5 MG tablet Take 5 mg by mouth 2 (two) times daily.     [provider]  aspirin 325 MG tablet Take 325 mg by mouth daily.    [provider]  atorvastatin (LIPITOR) 40 MG tablet SMARTSIG:1 Tablet(s) By Mouth Every Evening 11/18/20   [provider]  famotidine (PEPCID) 20 MG tablet Take 20 mg by mouth daily. 09/22/19   [provider]  glipiZIDE (GLUCOTROL XL) 5 MG 24 hr tablet Take 5 mg by mouth daily with breakfast.    [provider]  hydrALAZINE (APRESOLINE) 25 MG tablet Take 25 mg by mouth 2  (two) times daily. 08/20/19   [provider]  LANTUS SOLOSTAR 100 UNIT/ML Solostar Pen INJECT 30 UNITS SUBCUTANEOUSLY IN THE MORNING 04/29/18   [provider]  metFORMIN (GLUCOPHAGE) 500 MG tablet Take 500 mg by mouth 2 (two) times daily with a meal.    [provider]  metoprolol (LOPRESSOR) 50 MG tablet Take 50 mg by mouth 2 (two) times daily.    [provider]  metoprolol succinate (TOPROL-XL) 25 MG 24 hr tablet Take 25 mg by mouth daily. 08/20/19   [provider]  pravastatin (PRAVACHOL) 40 MG tablet Take 40 mg by mouth at bedtime.    [provider]  sertraline (ZOLOFT) 50 MG tablet Take 50 mg by mouth daily.    [provider]  lisinopril-hydrochlorothiazide (PRINZIDE,ZESTORETIC) 10-12.5 MG per tablet Take 1 tablet by mouth daily.  10/08/19  [provider]    Family History Family History  Problem Relation Age of Onset  . Diabetes Mellitus II Mother   . Hypertension Mother   . Diabetes Mellitus II Father   . Hypertension Father   . Hypertension Sister   . Diabetes Sister     Social History Social History   Tobacco Use  . Smoking status: Current Every Day Smoker    Types: Cigars  . Smokeless tobacco: Never Used  . Tobacco comment: Black & Milds 1 a day  Substance  Use Topics  . Alcohol use: Yes  . Drug use: Yes    Types: Marijuana    Comment: occasional     Allergies   Patient has no known allergies.   Review of Systems Review of Systems Per HPI Physical Exam Triage Vital Signs ED Triage Vitals  Enc Vitals Group     BP 01/28/21 1143 128/78     Pulse Rate 01/28/21 1142 61     Resp 01/28/21 1142 17     Temp 01/28/21 1142 98.2 F (36.8 C)     Temp src --      SpO2 01/28/21 1142 100 %     Weight --      Height --      Head Circumference --      Peak Flow --      Pain Score 01/28/21 1141 3     Pain Loc --      Pain Edu? --      Excl. in [employer]? --    No data found.  Updated Vital  Signs BP 128/78   Pulse 61   Temp 98.2 F (36.8 C)   Resp 17   SpO2 100%   Visual Acuity Right Eye Distance:   Left Eye Distance:   Bilateral Distance:    Right Eye Near:   Left Eye Near:    Bilateral Near:     Physical Exam Vitals and nursing note reviewed. Exam conducted with a chaperone present.  Constitutional:      Appearance: Normal appearance.  HENT:     Head: Atraumatic.  Eyes:     Extraocular Movements: Extraocular movements intact.     Conjunctiva/sclera: Conjunctivae normal.  Cardiovascular:     Rate and Rhythm: Normal rate and regular rhythm.  Pulmonary:     Effort: Pulmonary effort is normal.     Breath sounds: Normal breath sounds.  Musculoskeletal:        General: Normal range of motion.     Cervical back: Normal range of motion and neck supple.  Skin:    General: Skin is warm and dry.     Comments: Marble sized firm, nonfluctuant or indurated nodule left perineum.  No erythema, active drainage, warmth to the area.  Neurological:     General: No focal deficit present.     Mental Status: He is oriented to person, place, and time.  Psychiatric:        Mood and Affect: Mood normal.        Thought Content: Thought content normal.        Judgment: Judgment normal.      UC Treatments / Results  Labs (all labs ordered are listed, but only abnormal results are displayed) Labs Reviewed - No data to display  EKG   Radiology No results found.  Procedures Procedures (including critical care time)  Medications Ordered in UC Medications - No data to display  Initial Impression / Assessment and Plan / UC Course  I have reviewed the triage vital signs and the nursing notes.  Pertinent labs & imaging results that were available during my care of the patient were reviewed by me and considered in my medical decision making (see chart for details).     No indication for I&D to the area today as it is very early on and nonfluctuant.  Will start 10-day  course of Bactrim and frequent warm compresses, Epsom salt soaks.  Follow-up in the next week if worsening at any time or for  recheck if not fully resolving.  Final Clinical Impressions(s) / UC Diagnoses   Final diagnoses:  Perineal abscess     Discharge Instructions     Follow-up in 1 week for a recheck of the area if not fully resolved    ED Prescriptions    Medication Sig Dispense Auth. Provider   sulfamethoxazole-trimethoprim (BACTRIM DS) 800-160 MG tablet Take 1 tablet by mouth 2 (two) times daily. 20 tablet Volney American, [place]     PDMP not reviewed this encounter.   Volney American, [place] 01/28/21 1218

## 2021-01-28 NOTE — ED Triage Notes (Signed)
Pt in with c/o abscess in left groin that he noticed yesterday. Denies any drainage  Pt has not had medication for pain

## 2022-01-29 DIAGNOSIS — I639 Cerebral infarction, unspecified: Secondary | ICD-10-CM

## 2022-01-29 HISTORY — DX: Cerebral infarction, unspecified: I63.9

## 2022-02-19 ENCOUNTER — Other Ambulatory Visit: Payer: Self-pay

## 2022-02-19 DIAGNOSIS — N189 Chronic kidney disease, unspecified: Secondary | ICD-10-CM

## 2022-02-20 ENCOUNTER — Encounter: Payer: Medicaid Other | Admitting: Surgery

## 2022-02-20 ENCOUNTER — Encounter (HOSPITAL_COMMUNITY): Payer: Medicaid Other

## 2022-02-20 ENCOUNTER — Other Ambulatory Visit (HOSPITAL_COMMUNITY): Payer: Medicaid Other

## 2022-03-02 ENCOUNTER — Ambulatory Visit (HOSPITAL_COMMUNITY)
Admission: RE | Admit: 2022-03-02 | Discharge: 2022-03-02 | Disposition: A | Discharge status: Home / Self Care | Payer: Medicaid Other | Source: Ambulatory Visit | Attending: Vascular Surgery | Admitting: Vascular Surgery

## 2022-03-02 ENCOUNTER — Encounter: Payer: Self-pay | Admitting: Vascular Surgery

## 2022-03-02 ENCOUNTER — Ambulatory Visit (INDEPENDENT_AMBULATORY_CARE_PROVIDER_SITE_OTHER)
Admission: RE | Admit: 2022-03-02 | Discharge: 2022-03-02 | Disposition: A | Discharge status: Home / Self Care | Payer: Medicaid Other | Source: Ambulatory Visit | Attending: Vascular Surgery | Admitting: Vascular Surgery

## 2022-03-02 ENCOUNTER — Ambulatory Visit (INDEPENDENT_AMBULATORY_CARE_PROVIDER_SITE_OTHER): Payer: Medicaid Other | Admitting: Vascular Surgery

## 2022-03-02 ENCOUNTER — Other Ambulatory Visit: Payer: Self-pay

## 2022-03-02 VITALS — BP 163/97 | HR 62 | Temp 98.2°F | Resp 20 | Ht 67.0 in | Wt 143.0 lb

## 2022-03-02 DIAGNOSIS — N189 Chronic kidney disease, unspecified: Secondary | ICD-10-CM

## 2022-03-02 DIAGNOSIS — N184 Chronic kidney disease, stage 4 (severe): Secondary | ICD-10-CM

## 2022-03-02 NOTE — H&P (View-Only) (Signed)
? ?ASSESSMENT & PLAN  ? ?STAGE IV CHRONIC KIDNEY DISEASE: I think the patient's only option for a fistula in the right arm would be a basilic vein transposition which would be done in 2 stages.  I explained the indications for the procedure and the potential complications including but not limited to wound healing problems, bleeding, and steal syndrome.  We have also discussed the risk of failure of the fistula or the need for intervention.  ? ?REASON FOR CONSULT:   ? ?To evaluate for hemodialysis access.  The consult is requested by Dr. Joelyn Oms. ? ?HPI:  ? ?Keith [name] is a 51 y.o. male who was referred for evaluation for hemodialysis access.  Of note he is left-handed.  Of note, if we are unable to place a fistula we were asked to not place an AV graft at this time.  I have reviewed the records from the referring office.  Most recent labs show a creatinine of 4.78.  GFR was 15.  The patient was seen on 01/03/2022.  He has a history of progressive stage IV chronic kidney disease.  It is felt that he will likely need dialysis in the near future.  His past medical history is also significant for a history of a stroke in 2012, type 2 diabetes, hypertension. ? ?My history the patient denies any recent uremic symptoms.  He denies any significant shortness of breath nausea or vomiting. ? ?Past Medical History:  ?Diagnosis Date  ? Chronic kidney disease   ? Diabetes mellitus   ? Hypercholesteremia   ? Hypertension   ? Stroke [facility])   ? ? ?Family History  ?Problem Relation Age of Onset  ? Diabetes Mellitus II Mother   ? Hypertension Mother   ? Diabetes Mellitus II Father   ? Hypertension Father   ? Hypertension Sister   ? Diabetes Sister   ? ? ?SOCIAL HISTORY: ?Social History  ? ?Tobacco Use  ? Smoking status: Every Day  ?  Types: Cigars  ? Smokeless tobacco: Never  ? Tobacco comments:  ?  Black & Milds 1 a day  ?Substance Use Topics  ? Alcohol use: Yes  ? ? ?No Known Allergies ? ?Current Outpatient Medications   ?Medication Sig Dispense Refill  ? amLODipine (NORVASC) 5 MG tablet Take 5 mg by mouth 2 (two) times daily.     ? aspirin 325 MG tablet Take 325 mg by mouth daily.    ? atorvastatin (LIPITOR) 40 MG tablet SMARTSIG:1 Tablet(s) By Mouth Every Evening    ? famotidine (PEPCID) 20 MG tablet Take 20 mg by mouth daily.    ? glipiZIDE (GLUCOTROL XL) 5 MG 24 hr tablet Take 5 mg by mouth daily with breakfast.    ? hydrALAZINE (APRESOLINE) 25 MG tablet Take 25 mg by mouth 2 (two) times daily.    ? LANTUS SOLOSTAR 100 UNIT/ML Solostar Pen INJECT 30 UNITS SUBCUTANEOUSLY IN THE MORNING  6  ? metFORMIN (GLUCOPHAGE) 500 MG tablet Take 500 mg by mouth 2 (two) times daily with a meal.    ? metoprolol (LOPRESSOR) 50 MG tablet Take 50 mg by mouth 2 (two) times daily.    ? metoprolol succinate (TOPROL-XL) 25 MG 24 hr tablet Take 25 mg by mouth daily.    ? pravastatin (PRAVACHOL) 40 MG tablet Take 40 mg by mouth at bedtime.    ? sertraline (ZOLOFT) 50 MG tablet Take 50 mg by mouth daily.    ? sulfamethoxazole-trimethoprim (BACTRIM DS) 800-160 MG tablet  Take 1 tablet by mouth 2 (two) times daily. (Patient not taking: Reported on 03/02/2022) 20 tablet 0  ? ?No current facility-administered medications for this visit.  ? ? ?REVIEW OF SYSTEMS:  ?[X]  denotes positive finding, [ ]  denotes negative finding ?Cardiac  Comments:  ?Chest pain or chest pressure:    ?Shortness of breath upon exertion:    ?Short of breath when lying flat:    ?Irregular heart rhythm:    ?    ?Vascular    ?Pain in calf, thigh, or hip brought on by ambulation:    ?Pain in feet at night that wakes you up from your sleep:     ?Blood clot in your veins:    ?Leg swelling:     ?    ?Pulmonary    ?Oxygen at home:    ?Productive cough:     ?Wheezing:     ?    ?Neurologic    ?Sudden weakness in arms or legs:     ?Sudden numbness in arms or legs:     ?Sudden onset of difficulty speaking or slurred speech:    ?Temporary loss of vision in one eye:     ?Problems with dizziness:      ?    ?Gastrointestinal    ?Blood in stool:     ?Vomited blood:     ?    ?Genitourinary    ?Burning when urinating:     ?Blood in urine:    ?    ?Psychiatric    ?Major depression:  x   ?    ?Hematologic    ?Bleeding problems:    ?Problems with blood clotting too easily:    ?    ?Skin    ?Rashes or ulcers:    ?    ?Constitutional    ?Fever or chills:    ?- ? ?PHYSICAL EXAM:  ? ?Vitals:  ? 03/02/22 1036  ?BP: (!) 163/97  ?Pulse: 62  ?Resp: 20  ?Temp: 98.2 ?F (36.8 ?C)  ?SpO2: 99%  ?Weight: 143 lb (64.9 kg)  ?Height: 5\' 7"  (1.702 m)  ? ?Body mass index is 22.4 kg/m?. ?GENERAL: The patient is a well-nourished male, in no acute distress. The vital signs are documented above. ?CARDIAC: There is a regular rate and rhythm.  ?VASCULAR: I do not detect carotid bruits. ?He has palpable radial pulses. ?PULMONARY: There is good air exchange bilaterally without wheezing or rales. ?ABDOMEN: Soft and non-tender with normal pitched bowel sounds.  ?MUSCULOSKELETAL: There are no major deformities. ?NEUROLOGIC: No focal weakness or paresthesias are detected. ?SKIN: There are no ulcers or rashes noted. ?PSYCHIATRIC: The patient has a normal affect. ? ?DATA:   ? ?UPPER EXTREMITY VEIN MAP: I have independently interpreted his upper extremity vein map today. ? ?On the right side the upper arm and forearm cephalic vein do not appear adequate.  The basilic vein looks reasonable. ? ?On the left side the forearm and upper arm cephalic vein do not appear adequate.  The basilic vein looks reasonable in size. ? ?UPPER EXTREMITY ARTERIAL DUPLEX: I have independently interpreted his upper extremity arterial duplex scan. ? ?On the right side the brachial artery measures 4.4 mm in diameter.  There is a triphasic radial and ulnar waveform. ? ?On the left side the brachial artery measures 5.8 mm in diameter.  There is a triphasic radial and ulnar waveform. ? ?Deitra Mayo ?[facility] ?

## 2022-03-02 NOTE — Progress Notes (Signed)
? ?ASSESSMENT & PLAN  ? ?STAGE IV CHRONIC KIDNEY DISEASE: I think the patient's only option for a fistula in the right arm would be a basilic vein transposition which would be done in 2 stages.  I explained the indications for the procedure and the potential complications including but not limited to wound healing problems, bleeding, and steal syndrome.  We have also discussed the risk of failure of the fistula or the need for intervention.  ? ?REASON FOR CONSULT:   ? ?To evaluate for hemodialysis access.  The consult is requested by Dr. Joelyn Oms. ? ?HPI:  ? ?Keith [name] is a 51 y.o. male who was referred for evaluation for hemodialysis access.  Of note he is left-handed.  Of note, if we are unable to place a fistula we were asked to not place an AV graft at this time.  I have reviewed the records from the referring office.  Most recent labs show a creatinine of 4.78.  GFR was 15.  The patient was seen on 01/03/2022.  He has a history of progressive stage IV chronic kidney disease.  It is felt that he will likely need dialysis in the near future.  His past medical history is also significant for a history of a stroke in 2012, type 2 diabetes, hypertension. ? ?My history the patient denies any recent uremic symptoms.  He denies any significant shortness of breath nausea or vomiting. ? ?Past Medical History:  ?Diagnosis Date  ? Chronic kidney disease   ? Diabetes mellitus   ? Hypercholesteremia   ? Hypertension   ? Stroke [facility])   ? ? ?Family History  ?Problem Relation Age of Onset  ? Diabetes Mellitus II Mother   ? Hypertension Mother   ? Diabetes Mellitus II Father   ? Hypertension Father   ? Hypertension Sister   ? Diabetes Sister   ? ? ?SOCIAL HISTORY: ?Social History  ? ?Tobacco Use  ? Smoking status: Every Day  ?  Types: Cigars  ? Smokeless tobacco: Never  ? Tobacco comments:  ?  Black & Milds 1 a day  ?Substance Use Topics  ? Alcohol use: Yes  ? ? ?No Known Allergies ? ?Current Outpatient Medications   ?Medication Sig Dispense Refill  ? amLODipine (NORVASC) 5 MG tablet Take 5 mg by mouth 2 (two) times daily.     ? aspirin 325 MG tablet Take 325 mg by mouth daily.    ? atorvastatin (LIPITOR) 40 MG tablet SMARTSIG:1 Tablet(s) By Mouth Every Evening    ? famotidine (PEPCID) 20 MG tablet Take 20 mg by mouth daily.    ? glipiZIDE (GLUCOTROL XL) 5 MG 24 hr tablet Take 5 mg by mouth daily with breakfast.    ? hydrALAZINE (APRESOLINE) 25 MG tablet Take 25 mg by mouth 2 (two) times daily.    ? LANTUS SOLOSTAR 100 UNIT/ML Solostar Pen INJECT 30 UNITS SUBCUTANEOUSLY IN THE MORNING  6  ? metFORMIN (GLUCOPHAGE) 500 MG tablet Take 500 mg by mouth 2 (two) times daily with a meal.    ? metoprolol (LOPRESSOR) 50 MG tablet Take 50 mg by mouth 2 (two) times daily.    ? metoprolol succinate (TOPROL-XL) 25 MG 24 hr tablet Take 25 mg by mouth daily.    ? pravastatin (PRAVACHOL) 40 MG tablet Take 40 mg by mouth at bedtime.    ? sertraline (ZOLOFT) 50 MG tablet Take 50 mg by mouth daily.    ? sulfamethoxazole-trimethoprim (BACTRIM DS) 800-160 MG tablet  Take 1 tablet by mouth 2 (two) times daily. (Patient not taking: Reported on 03/02/2022) 20 tablet 0  ? ?No current facility-administered medications for this visit.  ? ? ?REVIEW OF SYSTEMS:  ?[X]  denotes positive finding, [ ]  denotes negative finding ?Cardiac  Comments:  ?Chest pain or chest pressure:    ?Shortness of breath upon exertion:    ?Short of breath when lying flat:    ?Irregular heart rhythm:    ?    ?Vascular    ?Pain in calf, thigh, or hip brought on by ambulation:    ?Pain in feet at night that wakes you up from your sleep:     ?Blood clot in your veins:    ?Leg swelling:     ?    ?Pulmonary    ?Oxygen at home:    ?Productive cough:     ?Wheezing:     ?    ?Neurologic    ?Sudden weakness in arms or legs:     ?Sudden numbness in arms or legs:     ?Sudden onset of difficulty speaking or slurred speech:    ?Temporary loss of vision in one eye:     ?Problems with dizziness:      ?    ?Gastrointestinal    ?Blood in stool:     ?Vomited blood:     ?    ?Genitourinary    ?Burning when urinating:     ?Blood in urine:    ?    ?Psychiatric    ?Major depression:  x   ?    ?Hematologic    ?Bleeding problems:    ?Problems with blood clotting too easily:    ?    ?Skin    ?Rashes or ulcers:    ?    ?Constitutional    ?Fever or chills:    ?- ? ?PHYSICAL EXAM:  ? ?Vitals:  ? 03/02/22 1036  ?BP: (!) 163/97  ?Pulse: 62  ?Resp: 20  ?Temp: 98.2 ?F (36.8 ?C)  ?SpO2: 99%  ?Weight: 143 lb (64.9 kg)  ?Height: 5\' 7"  (1.702 m)  ? ?Body mass index is 22.4 kg/m?. ?GENERAL: The patient is a well-nourished male, in no acute distress. The vital signs are documented above. ?CARDIAC: There is a regular rate and rhythm.  ?VASCULAR: I do not detect carotid bruits. ?He has palpable radial pulses. ?PULMONARY: There is good air exchange bilaterally without wheezing or rales. ?ABDOMEN: Soft and non-tender with normal pitched bowel sounds.  ?MUSCULOSKELETAL: There are no major deformities. ?NEUROLOGIC: No focal weakness or paresthesias are detected. ?SKIN: There are no ulcers or rashes noted. ?PSYCHIATRIC: The patient has a normal affect. ? ?DATA:   ? ?UPPER EXTREMITY VEIN MAP: I have independently interpreted his upper extremity vein map today. ? ?On the right side the upper arm and forearm cephalic vein do not appear adequate.  The basilic vein looks reasonable. ? ?On the left side the forearm and upper arm cephalic vein do not appear adequate.  The basilic vein looks reasonable in size. ? ?UPPER EXTREMITY ARTERIAL DUPLEX: I have independently interpreted his upper extremity arterial duplex scan. ? ?On the right side the brachial artery measures 4.4 mm in diameter.  There is a triphasic radial and ulnar waveform. ? ?On the left side the brachial artery measures 5.8 mm in diameter.  There is a triphasic radial and ulnar waveform. ? ?Deitra Mayo ?[facility] [place] ?

## 2022-03-09 ENCOUNTER — Inpatient Hospital Stay (HOSPITAL_COMMUNITY): Admission: RE | Admit: 2022-03-09 | Payer: Medicaid Other | Source: Ambulatory Visit

## 2022-03-10 ENCOUNTER — Encounter (HOSPITAL_COMMUNITY): Payer: Self-pay | Admitting: Vascular Surgery

## 2022-03-10 ENCOUNTER — Other Ambulatory Visit: Payer: Self-pay

## 2022-03-10 NOTE — Progress Notes (Signed)
Mr. Burdell denies chest pain or shortness of breath. ?Patient denies having any s/s of Covid in his household.  Patient denies any known exposure to Covid.  ? ?Mr. Vallecillo has type II diabetes, he reports that CBGs run low in am - 60-70, in the evening CBG may be in the 300's.  I encouraged Mr. Iyengar to eat a snack Sunday evening before he goes to bed. I instruced Mr. Waymire to not take Metformin or Glipizide on Monday am, if CBG is > 70 take 10 units of Lantus.I instructed patient to check CBG after awaking and every 2 hours until arrival  to the hospital.  ?I Instructed patient if CBG is less than 70 to take 4 Glucose Tablets or 1 tube of Glucose Gel or 1/2 cup of a clear juice. Recheck CBG in 15 minutes if CBG is not over 70 call, pre- op desk at [phone] for further instructions. If scheduled to receive Insulin, do not take Insulin. ? ?Mr. Rosana Hoes PCP is Dr. Dixie Dials, Nephrologist is Dr. Joelyn Oms. I requested records from Dr Merrilee Jansky office. ? ?I instructed  Mr. Chio   to shower with antibacteria soap.   No nail polish, artificial or acrylic nails. Wear clean clothes, brush your teeth. ?Glasses, contact lens,dentures or partials may not be worn in the OR. If you need to wear them, please bring a case for glasses, do not wear contacts or bring a case, the hospital does not have contact cases, dentures or partials will have to be removed , make sure they are clean, we will provide a denture cup to put them in. You will need some one to drive you home and a responsible person over the age of 96 to stay with you for the first 24 hours after surgery.  ? ?

## 2022-03-12 NOTE — Anesthesia Preprocedure Evaluation (Addendum)
Anesthesia Evaluation  ?Patient identified by MRN, date of birth, ID band ?Patient awake ? ? ? ?Reviewed: ?Allergy & Precautions, NPO status , Patient's Chart, lab work & pertinent test results ? ?Airway ?Mallampati: II ? ?TM Distance: >3 FB ?Neck ROM: Full ? ? ? Dental ?no notable dental hx. ? ?  ?Pulmonary ?neg pulmonary ROS, Current Smoker and Patient abstained from smoking.,  ?  ?Pulmonary exam normal ? ? ? ? ? ? ? Cardiovascular ?hypertension, Pt. on medications and Pt. on home beta blockers ? ?Rhythm:Regular Rate:Normal ? ? ?  ?Neuro/Psych ?CVA negative psych ROS  ? GI/Hepatic ?negative GI ROS, Neg liver ROS,   ?Endo/Other  ?diabetes, Type 2, Oral Hypoglycemic Agents, Insulin Dependent ? Renal/GU ?ESRFRenal disease  ?negative genitourinary ?  ?Musculoskeletal ?negative musculoskeletal ROS ?(+)  ? Abdominal ?Normal abdominal exam  (+)   ?Peds ? Hematology ?negative hematology ROS ?(+)   ?Anesthesia Other Findings ? ? Reproductive/Obstetrics ? ?  ? ? ? ? ? ? ? ? ? ? ? ? ? ?  ?  ? ? ? ? ? ? ? ?Anesthesia Physical ?Anesthesia Plan ? ?ASA: 3 ? ?Anesthesia Plan: MAC and Regional  ? ?Post-op Pain Management: Tylenol PO (pre-op)*  ? ?Induction: Intravenous ? ?PONV Risk Score and Plan: Ondansetron, Dexamethasone, Propofol infusion and Treatment may vary due to age or medical condition ? ?Airway Management Planned: Simple Face Mask, Natural Airway and Nasal Cannula ? ?Additional Equipment: None ? ?Intra-op Plan:  ? ?Post-operative Plan:  ? ?Informed Consent: I have reviewed the patients History and Physical, chart, labs and discussed the procedure including the risks, benefits and alternatives for the proposed anesthesia with the patient or authorized representative who has indicated his/her understanding and acceptance.  ? ? ? ?Dental advisory given ? ?Plan Discussed with: CRNA ? ?Anesthesia Plan Comments:   ? ? ? ? ? ?Anesthesia Quick Evaluation ? ?

## 2022-03-13 ENCOUNTER — Encounter (HOSPITAL_COMMUNITY)
Admission: RE | Disposition: A | Discharge status: Home / Self Care | Payer: Self-pay | Source: Home / Self Care | Attending: Vascular Surgery

## 2022-03-13 ENCOUNTER — Ambulatory Visit (HOSPITAL_BASED_OUTPATIENT_CLINIC_OR_DEPARTMENT_OTHER): Payer: Medicaid Other | Admitting: Anesthesiology

## 2022-03-13 ENCOUNTER — Ambulatory Visit (HOSPITAL_COMMUNITY): Payer: Medicaid Other | Admitting: Anesthesiology

## 2022-03-13 ENCOUNTER — Encounter (HOSPITAL_COMMUNITY): Payer: Self-pay | Admitting: Vascular Surgery

## 2022-03-13 ENCOUNTER — Ambulatory Visit (HOSPITAL_COMMUNITY)
Admission: RE | Admit: 2022-03-13 | Discharge: 2022-03-13 | Disposition: A | Discharge status: Home / Self Care | Payer: Medicaid Other | Attending: Vascular Surgery | Admitting: Vascular Surgery

## 2022-03-13 ENCOUNTER — Other Ambulatory Visit: Payer: Self-pay

## 2022-03-13 DIAGNOSIS — Z8673 Personal history of transient ischemic attack (TIA), and cerebral infarction without residual deficits: Secondary | ICD-10-CM | POA: Insufficient documentation

## 2022-03-13 DIAGNOSIS — I120 Hypertensive chronic kidney disease with stage 5 chronic kidney disease or end stage renal disease: Secondary | ICD-10-CM | POA: Diagnosis not present

## 2022-03-13 DIAGNOSIS — I129 Hypertensive chronic kidney disease with stage 1 through stage 4 chronic kidney disease, or unspecified chronic kidney disease: Secondary | ICD-10-CM | POA: Insufficient documentation

## 2022-03-13 DIAGNOSIS — Z7984 Long term (current) use of oral hypoglycemic drugs: Secondary | ICD-10-CM

## 2022-03-13 DIAGNOSIS — N184 Chronic kidney disease, stage 4 (severe): Secondary | ICD-10-CM | POA: Diagnosis not present

## 2022-03-13 DIAGNOSIS — F17290 Nicotine dependence, other tobacco product, uncomplicated: Secondary | ICD-10-CM | POA: Diagnosis not present

## 2022-03-13 DIAGNOSIS — Z794 Long term (current) use of insulin: Secondary | ICD-10-CM | POA: Insufficient documentation

## 2022-03-13 DIAGNOSIS — N186 End stage renal disease: Secondary | ICD-10-CM | POA: Diagnosis not present

## 2022-03-13 DIAGNOSIS — E1122 Type 2 diabetes mellitus with diabetic chronic kidney disease: Secondary | ICD-10-CM

## 2022-03-13 HISTORY — PX: BASCILIC VEIN TRANSPOSITION: SHX5742

## 2022-03-13 LAB — GLUCOSE, CAPILLARY
Glucose-Capillary: 313 mg/dL — ABNORMAL HIGH (ref 70–99)
Glucose-Capillary: <10 mg/dL — CL (ref 70–99)
Glucose-Capillary: 50 mg/dL — ABNORMAL LOW (ref 70–99)
Glucose-Capillary: 93 mg/dL (ref 70–99)

## 2022-03-13 LAB — POCT I-STAT, CHEM 8
BUN: 48 mg/dL — ABNORMAL HIGH (ref 6–20)
Calcium, Ion: 1.11 mmol/L — ABNORMAL LOW (ref 1.15–1.40)
Chloride: 109 mmol/L (ref 98–111)
Creatinine, Ser: 7.6 mg/dL — ABNORMAL HIGH (ref 0.61–1.24)
Glucose, Bld: 326 mg/dL — ABNORMAL HIGH (ref 70–99)
HCT: 27 % — ABNORMAL LOW (ref 39.0–52.0)
Hemoglobin: 9.2 g/dL — ABNORMAL LOW (ref 13.0–17.0)
Potassium: 4.2 mmol/L (ref 3.5–5.1)
Sodium: 137 mmol/L (ref 135–145)
TCO2: 18 mmol/L — ABNORMAL LOW (ref 22–32)

## 2022-03-13 SURGERY — TRANSPOSITION, VEIN, BASILIC
Anesthesia: Monitor Anesthesia Care | Site: Arm Upper | Laterality: Right

## 2022-03-13 MED ORDER — PROPOFOL 10 MG/ML IV BOLUS
INTRAVENOUS | Status: AC
  Filled 2022-03-13: qty 20

## 2022-03-13 MED ORDER — HEPARIN 6000 UNIT IRRIGATION SOLUTION
Status: DC | PRN
  Administered 2022-03-13: 1

## 2022-03-13 MED ORDER — MIDAZOLAM HCL 2 MG/2ML IJ SOLN
INTRAMUSCULAR | Status: DC | PRN
  Administered 2022-03-13: 1 mg via INTRAVENOUS

## 2022-03-13 MED ORDER — ORAL CARE MOUTH RINSE: 15.0000 mL | Freq: Once | OROMUCOSAL | Status: AC

## 2022-03-13 MED ORDER — PROPOFOL 10 MG/ML IV BOLUS
INTRAVENOUS | Status: DC | PRN
  Administered 2022-03-13: 25 ug/kg/min via INTRAVENOUS

## 2022-03-13 MED ORDER — FENTANYL CITRATE (PF) 250 MCG/5ML IJ SOLN
INTRAMUSCULAR | Status: DC | PRN
Start: 2022-03-13 — End: 2022-03-13
  Administered 2022-03-13 (×2): 25 ug via INTRAVENOUS
  Administered 2022-03-13: 50 ug via INTRAVENOUS

## 2022-03-13 MED ORDER — LIDOCAINE-EPINEPHRINE (PF) 1 %-1:200000 IJ SOLN
INTRAMUSCULAR | Status: AC
  Filled 2022-03-13: qty 30

## 2022-03-13 MED ORDER — ONDANSETRON HCL 4 MG/2ML IJ SOLN
INTRAMUSCULAR | Status: DC | PRN
  Administered 2022-03-13: 4 mg via INTRAVENOUS

## 2022-03-13 MED ORDER — SODIUM CHLORIDE 0.9 % IV SOLN: INTRAVENOUS | Status: DC

## 2022-03-13 MED ORDER — PROTAMINE SULFATE 10 MG/ML IV SOLN
INTRAVENOUS | Status: DC | PRN
  Administered 2022-03-13: 30 mg via INTRAVENOUS

## 2022-03-13 MED ORDER — PAPAVERINE HCL 30 MG/ML IJ SOLN
INTRAMUSCULAR | Status: AC
  Filled 2022-03-13: qty 2

## 2022-03-13 MED ORDER — CHLORHEXIDINE GLUCONATE 0.12 % MT SOLN
15.0000 mL | Freq: Once | OROMUCOSAL | Status: AC
  Administered 2022-03-13: 15 mL via OROMUCOSAL
  Filled 2022-03-13: qty 15

## 2022-03-13 MED ORDER — DEXTROSE 50 % IV SOLN
25.0000 mL | Freq: Once | INTRAVENOUS | Status: AC
  Administered 2022-03-13: 25 mL via INTRAVENOUS

## 2022-03-13 MED ORDER — HEPARIN 6000 UNIT IRRIGATION SOLUTION
Status: AC
  Filled 2022-03-13: qty 500

## 2022-03-13 MED ORDER — CEFAZOLIN SODIUM-DEXTROSE 2-4 GM/100ML-% IV SOLN
2.0000 g | INTRAVENOUS | Status: AC
  Administered 2022-03-13: 2 g via INTRAVENOUS
  Filled 2022-03-13: qty 100

## 2022-03-13 MED ORDER — INSULIN ASPART 100 UNIT/ML IJ SOLN
0.0000 [IU] | INTRAMUSCULAR | Status: DC | PRN
  Administered 2022-03-13: 10 [IU] via SUBCUTANEOUS
  Filled 2022-03-13: qty 1

## 2022-03-13 MED ORDER — FENTANYL CITRATE (PF) 250 MCG/5ML IJ SOLN
INTRAMUSCULAR | Status: AC
  Filled 2022-03-13: qty 5

## 2022-03-13 MED ORDER — PHENYLEPHRINE HCL-NACL 20-0.9 MG/250ML-% IV SOLN
INTRAVENOUS | Status: DC | PRN
Start: 2022-03-13 — End: 2022-03-13
  Administered 2022-03-13: 50 ug/min via INTRAVENOUS

## 2022-03-13 MED ORDER — LIDOCAINE-EPINEPHRINE (PF) 1 %-1:200000 IJ SOLN
INTRAMUSCULAR | Status: DC | PRN
  Administered 2022-03-13: 2 mL

## 2022-03-13 MED ORDER — MIDAZOLAM HCL 2 MG/2ML IJ SOLN
INTRAMUSCULAR | Status: AC
  Filled 2022-03-13: qty 2

## 2022-03-13 MED ORDER — MEPIVACAINE HCL (PF) 2 % IJ SOLN
INTRAMUSCULAR | Status: DC | PRN
  Administered 2022-03-13: 20 mL

## 2022-03-13 MED ORDER — ACETAMINOPHEN 500 MG PO TABS
1000.0000 mg | ORAL_TABLET | Freq: Once | ORAL | Status: AC
  Administered 2022-03-13: 1000 mg via ORAL
  Filled 2022-03-13: qty 2

## 2022-03-13 MED ORDER — PROPOFOL 500 MG/50ML IV EMUL
INTRAVENOUS | Status: DC | PRN
  Administered 2022-03-13 (×2): 15 mg via INTRAVENOUS

## 2022-03-13 MED ORDER — LIDOCAINE HCL (PF) 1 % IJ SOLN
INTRAMUSCULAR | Status: AC
  Filled 2022-03-13: qty 30

## 2022-03-13 MED ORDER — CHLORHEXIDINE GLUCONATE 4 % EX LIQD: 60.0000 mL | Freq: Once | CUTANEOUS | Status: DC

## 2022-03-13 MED ORDER — OXYCODONE-ACETAMINOPHEN 5-325 MG PO TABS
1.0000 | ORAL_TABLET | ORAL | 0 refills | Status: AC | PRN
Start: 2022-03-13 — End: 2023-03-13

## 2022-03-13 MED ORDER — DEXTROSE 50 % IV SOLN
INTRAVENOUS | Status: AC
  Filled 2022-03-13: qty 50

## 2022-03-13 MED ORDER — 0.9 % SODIUM CHLORIDE (POUR BTL) OPTIME
TOPICAL | Status: DC | PRN
  Administered 2022-03-13: 1000 mL

## 2022-03-13 MED ORDER — HEPARIN SODIUM (PORCINE) 1000 UNIT/ML IJ SOLN
INTRAMUSCULAR | Status: DC | PRN
  Administered 2022-03-13: 6000 [IU] via INTRAVENOUS

## 2022-03-13 SURGICAL SUPPLY — 32 items
ARMBAND PINK RESTRICT EXTREMIT (MISCELLANEOUS) ×2 IMPLANT
BAG COUNTER SPONGE SURGICOUNT (BAG) ×2 IMPLANT
CANISTER SUCT 3000ML PPV (MISCELLANEOUS) ×2 IMPLANT
CANNULA VESSEL 3MM 2 BLNT TIP (CANNULA) ×2 IMPLANT
COVER PROBE W GEL 5X96 (DRAPES) IMPLANT
DERMABOND ADVANCED (GAUZE/BANDAGES/DRESSINGS) ×1
DERMABOND ADVANCED .7 DNX12 (GAUZE/BANDAGES/DRESSINGS) ×1 IMPLANT
ELECT REM PT RETURN 9FT ADLT (ELECTROSURGICAL) ×2
ELECTRODE REM PT RTRN 9FT ADLT (ELECTROSURGICAL) ×1 IMPLANT
GLOVE BIO SURGEON STRL SZ7.5 (GLOVE) ×2 IMPLANT
GLOVE BIOGEL PI IND STRL 8 (GLOVE) ×1 IMPLANT
GLOVE BIOGEL PI INDICATOR 8 (GLOVE) ×1
GLOVE SURG POLY ORTHO LF SZ7.5 (GLOVE) IMPLANT
GLOVE SURG UNDER LTX SZ8 (GLOVE) ×2 IMPLANT
GOWN STRL REUS W/ TWL LRG LVL3 (GOWN DISPOSABLE) ×3 IMPLANT
GOWN STRL REUS W/TWL LRG LVL3 (GOWN DISPOSABLE) ×6
KIT BASIN OR (CUSTOM PROCEDURE TRAY) ×2 IMPLANT
KIT TURNOVER KIT B (KITS) ×2 IMPLANT
NS IRRIG 1000ML POUR BTL (IV SOLUTION) ×2 IMPLANT
PACK CV ACCESS (CUSTOM PROCEDURE TRAY) ×2 IMPLANT
PAD ARMBOARD 7.5X6 YLW CONV (MISCELLANEOUS) ×4 IMPLANT
SLING ARM FOAM STRAP LRG (SOFTGOODS) IMPLANT
SLING ARM FOAM STRAP MED (SOFTGOODS) IMPLANT
SLING ARM IMMOBILIZER LRG (SOFTGOODS) ×1 IMPLANT
SPONGE SURGIFOAM ABS GEL 100 (HEMOSTASIS) IMPLANT
SUT MNCRL AB 4-0 PS2 18 (SUTURE) ×4 IMPLANT
SUT PROLENE 6 0 BV (SUTURE) ×4 IMPLANT
SUT VIC AB 3-0 SH 27 (SUTURE) ×2
SUT VIC AB 3-0 SH 27X BRD (SUTURE) ×2 IMPLANT
TOWEL GREEN STERILE (TOWEL DISPOSABLE) ×2 IMPLANT
UNDERPAD 30X36 HEAVY ABSORB (UNDERPADS AND DIAPERS) ×2 IMPLANT
WATER STERILE IRR 1000ML POUR (IV SOLUTION) ×2 IMPLANT

## 2022-03-13 NOTE — Interval H&P Note (Signed)
History and Physical Interval Note: ? ?03/13/2022 ?7:03 AM ? ?Keith [name]  has presented today for surgery, with the diagnosis of CKD IV.  The various methods of treatment have been discussed with the patient and family. After consideration of risks, benefits and other options for treatment, the patient has consented to  Procedure(s): ?RIGHT FIRST STAGE BASILIC VEIN TRANSPOSITION (Right) as a surgical intervention.  The patient's history has been reviewed, patient examined, no change in status, stable for surgery.  I have reviewed the patient's chart and labs.  Questions were answered to the patient's satisfaction.   ? ? ?Deitra Mayo ? ? ?

## 2022-03-13 NOTE — Progress Notes (Signed)
Notified Dr,. Stoltzfus of pt's CBG this am and told him that pt said he took 20 units of Lantus this am. Per Dr. Gloris Manchester give pt 10 units of Novolog per hyperglycemic protocol. ?

## 2022-03-13 NOTE — Anesthesia Procedure Notes (Signed)
Anesthesia Regional Block: Supraclavicular block  ? ?Pre-Anesthetic Checklist: , timeout performed,  Correct Patient, Correct Site, Correct Laterality,  Correct Procedure, Correct Position, site marked,  Risks and benefits discussed,  Surgical consent,  Pre-op evaluation,  At surgeon's request and post-op pain management ? ?Laterality: Right ? ?Prep: Dura Prep     ?  ?Needles:  ?Injection technique: Single-shot ? ?Needle Type: Echogenic Stimulator Needle   ? ? ?Needle Length: 5cm  ?Needle Gauge: 20  ? ? ? ?Additional Needles: ? ? ?Procedures:,,,, ultrasound used (permanent image in chart),,    ?Narrative:  ?Start time: 03/13/2022 7:08 AM ?End time: 03/13/2022 7:11 AM ?Injection made incrementally with aspirations every 5 mL. ? ?Performed by: Personally  ?Anesthesiologist: Darral Dash, DO ? ?Additional Notes: ?Patient identified. Risks/Benefits/Options discussed with patient including but not limited to bleeding, infection, nerve damage, failed block, incomplete pain control. Patient expressed understanding and wished to proceed. All questions were answered. Sterile technique was used throughout the entire procedure. Please see nursing notes for vital signs. Aspirated in 5cc intervals with injection for negative confirmation. Patient was given instructions on fall risk and not to get out of bed. All questions and concerns addressed with instructions to call with any issues or inadequate analgesia.   ?  ? ? ? ? ?

## 2022-03-13 NOTE — Anesthesia Postprocedure Evaluation (Signed)
Anesthesia Post Note ? ?Patient: DYON ROTERT ? ?Procedure(s) Performed: RIGHT BRACHIOCEPHALIC ARTERIOVENOUS FISTULA CREATION (Right: Arm Upper) ? ?  ? ?Patient location during evaluation: PACU ?Anesthesia Type: Regional and MAC ?Level of consciousness: awake and alert ?Pain management: pain level controlled ?Vital Signs Assessment: post-procedure vital signs reviewed and stable ?Respiratory status: spontaneous breathing, nonlabored ventilation, respiratory function stable and patient connected to nasal cannula oxygen ?Cardiovascular status: stable and blood pressure returned to baseline ?Postop Assessment: no apparent nausea or vomiting ?Anesthetic complications: no ? ? ?No notable events documented. ? ?Last Vitals:  ?Vitals:  ? 03/13/22 0935 03/13/22 0950  ?BP: (!) 108/57 112/64  ?Pulse: (!) 57 61  ?Resp: 15 13  ?Temp:  36.6 ?C  ?SpO2: 100% 100%  ?  ?Last Pain:  ?Vitals:  ? 03/13/22 0950  ?TempSrc:   ?PainSc: 0-No pain  ? ? ?  ?  ?  ?  ?  ?  ? ?March Rummage Bridgid Printz ? ? ? ? ?

## 2022-03-13 NOTE — Op Note (Signed)
? ? ?  NAME: Keith [name]    MRN: [id number] ?DOB: [birth year]    DATE OF OPERATION: 03/13/2022 ? ?PREOP DIAGNOSIS:   ? ?Stage IV chronic kidney disease ? ?POSTOP DIAGNOSIS:   ? ?Same ? ?PROCEDURE:  ?  ?Right brachiocephalic AV fistula ? ?SURGEON: Judeth Cornfield. Scot Dock, MD ? ?ASSIST: Delena Serve, RNFA ? ?ANESTHESIA: Block ? ?EBL: Minimal ? ?INDICATIONS:  ? ? Keith [name] is a 51 y.o. male who is not yet on dialysis.  We were asked to place an AV fistula.  If he was not a candidate for a fistula we were not to place an AV graft. ? ?FINDINGS:  ? ?3 mm upper arm cephalic vein.  The vein was fairly far lateral and therefore I had to make separate incisions laterally to mobilize enough vein for anastomosis to the brachial artery.  Excellent thrill at the completion of the procedure with the radial and ulnar signal with the Doppler ? ?TECHNIQUE:  ? ?The patient was taken to the operating room and a block had been placed by anesthesia.  The right upper extremity was prepped and draped in usual sterile fashion.  I looked at the cephalic vein and basilic vein myself with the SonoSite and I felt that the cephalic vein looked reasonable in size for a fistula.  The vein was on the lateral aspect of the upper arm however in the artery more medial.  2 separate incisions were made over the cephalic vein above and below the antecubital area for mobilization of enough vein to swing it over for anastomosis to the brachial artery.  Branches were divided between clips and 3-0 silk ties.  The vein was ligated distally and irrigated up with heparinized saline.  A separate incision was made transversely over the brachial artery along the medial aspect of the upper arm and the brachial artery was dissected free.  The patient was heparinized.  A tunnel was created between the 2 incisions and the vein brought through the tunnel.  The brachial artery was clamped proximally and distally and a longitudinal arteriotomy was made.  The vein  was slightly spatulated and sewn end-to-side to the artery using continuous 6-0 Prolene suture.  At the completion there was an excellent thrill in the fistula.  There was a radial and ulnar signal with the Doppler.  The heparin was partially reversed with protamine.  Each of the wounds was closed with a deep layer of 3-0 Vicryl and the skin closed with 4-0 Monocryl.  Dermabond was applied.  The patient tolerated the procedure well and was transferred to the recovery room in stable condition.  All needle and sponge counts were correct. ? ?Given the complexity of the case,  the assistant was necessary in order to expedient the procedure and safely perform the technical aspects of the operation.  The assistant provided traction and countertraction to assist with exposure of the artery and vein.  They also assisted with suture ligation of multiple venous branches.  They played a critical role in the anastomosis. These skills, especially following the Prolene suture for the anastomosis, could not have been adequately performed by a scrub tech assistant.  ? ? ?Deitra Mayo, MD, FACS ?[facility] [place] ? ?DATE OF DICTATION:   03/13/2022 ? ?

## 2022-03-13 NOTE — Progress Notes (Signed)
Paged and called Dr. Gloris Manchester x2 to inform him of pt's CBG this am-313. Pt stated he took 20 units of Lantus this am. ?

## 2022-03-13 NOTE — Transfer of Care (Signed)
Immediate Anesthesia Transfer of Care Note ? ?Patient: Keith [name] ? ?Procedure(s) Performed: RIGHT BRACHIOCEPHALIC ARTERIOVENOUS FISTULA CREATION (Right: Arm Upper) ? ?Patient Location: PACU ? ?Anesthesia Type:MAC and Regional ? ?Level of Consciousness: drowsy, patient cooperative and responds to stimulation ? ?Airway & Oxygen Therapy: Patient Spontanous Breathing and Patient connected to nasal cannula oxygen ? ?Post-op Assessment: Report given to RN and Post -op Vital signs reviewed and unstable, Anesthesiologist notified .  POC BG "low"  VS otherwise stable. ? ?Post vital signs: Reviewed and stable ? ?Last Vitals:  ?Vitals Value Taken Time  ?BP 135/71 03/13/22 0918  ?Temp    ?Pulse 63 03/13/22 0919  ?Resp 16 03/13/22 0919  ?SpO2 100 % 03/13/22 0919  ?Vitals shown include unvalidated device data. ? ?Last Pain:  ?Vitals:  ? 03/13/22 0633  ?TempSrc:   ?PainSc: 0-No pain  ?   ? ?  ? ?Complications: No notable events documented. ?

## 2022-03-14 ENCOUNTER — Encounter (HOSPITAL_COMMUNITY): Payer: Self-pay | Admitting: Vascular Surgery

## 2022-04-19 ENCOUNTER — Other Ambulatory Visit: Payer: Self-pay | Admitting: *Deleted

## 2022-04-19 DIAGNOSIS — N184 Chronic kidney disease, stage 4 (severe): Secondary | ICD-10-CM

## 2022-04-19 NOTE — Progress Notes (Signed)
POST OPERATIVE OFFICE NOTE    CC:  F/u for surgery  HPI:  This is a 51 y.o. male who is s/p right BC AVF on 03/13/2022 by Dr. Scot Dock for stage IV CKD.  Pt states he does not have pain/numbness in the right hand.    The pt is not on dialysis.  His nephrologist is Dr. Joelyn Oms.    Pt works [employer].    No Known Allergies  Current Outpatient Medications  Medication Sig Dispense Refill   amLODipine (NORVASC) 5 MG tablet Take 5 mg by mouth 2 (two) times daily.      aspirin 325 MG tablet Take 325 mg by mouth daily.     atorvastatin (LIPITOR) 40 MG tablet SMARTSIG:1 Tablet(s) By Mouth Every Evening     famotidine (PEPCID) 20 MG tablet Take 20 mg by mouth daily.     glipiZIDE (GLUCOTROL XL) 5 MG 24 hr tablet Take 5 mg by mouth daily with breakfast.     hydrALAZINE (APRESOLINE) 25 MG tablet Take 25 mg by mouth 2 (two) times daily.     LANTUS SOLOSTAR 100 UNIT/ML Solostar Pen 20 Units.  6   metFORMIN (GLUCOPHAGE) 500 MG tablet Take 500 mg by mouth 2 (two) times daily with a meal.     metoprolol (LOPRESSOR) 50 MG tablet Take 50 mg by mouth 2 (two) times daily.     metoprolol succinate (TOPROL-XL) 25 MG 24 hr tablet Take 25 mg by mouth daily.     oxyCODONE-acetaminophen (PERCOCET) 5-325 MG tablet Take 1 tablet by mouth every 4 (four) hours as needed for severe pain. 15 tablet 0   pravastatin (PRAVACHOL) 40 MG tablet Take 40 mg by mouth at bedtime.     sertraline (ZOLOFT) 50 MG tablet Take 50 mg by mouth daily.     sulfamethoxazole-trimethoprim (BACTRIM DS) 800-160 MG tablet Take 1 tablet by mouth 2 (two) times daily. (Patient not taking: Reported on 03/02/2022) 20 tablet 0   No current facility-administered medications for this visit.     ROS:  See HPI  Physical Exam:   Incision:  well healed Extremities:   There is a palpable right radial pulse.   Motor and sensory are in tact.   There is a thrill/bruit present.  The fistula is easily palpable   Dialysis Duplex on  04/27/2022 +------------+----------+-------------+----------+----------------+  OUTFLOW VEINPSV (cm/s)Diameter (cm)Depth (cm)    Describe      +------------+----------+-------------+----------+----------------+  Shoulder       229        0.71        0.26                     +------------+----------+-------------+----------+----------------+  Prox UA        300        0.70        0.26        branch       +------------+----------+-------------+----------+----------------+  Mid UA         285        0.59        0.21   competing branch  +------------+----------+-------------+----------+----------------+  Dist UA        402        0.71        0.28                     +------------+----------+-------------+----------+----------------+  AC Fossa  0.43        0.31                     +------------+----------+-------------+----------+----------------+    Assessment/Plan:  This is a 51 y.o. male who is s/p: right BC AVF on 03/13/2022 by Dr. Scot Dock for stage IV CKD.  -the pt does not have evidence of steal. -the fistula is maturing nicely and is less than 53mm deep. It is easily palpable.  The fistula can be used 06/12/2022.  -discussed with pt that access does not last forever and will need intervention or even new access at some point.  -the pt will follow up as needed   Leontine Locket, [facility] [phone]  Clinic MD:  Scot Dock

## 2022-04-27 ENCOUNTER — Ambulatory Visit (HOSPITAL_COMMUNITY)
Admission: RE | Admit: 2022-04-27 | Discharge: 2022-04-27 | Disposition: A | Discharge status: Home / Self Care | Payer: Medicaid Other | Source: Ambulatory Visit | Attending: Vascular Surgery | Admitting: Vascular Surgery

## 2022-04-27 ENCOUNTER — Ambulatory Visit (INDEPENDENT_AMBULATORY_CARE_PROVIDER_SITE_OTHER): Payer: Medicaid Other | Admitting: Physician Assistant

## 2022-04-27 ENCOUNTER — Encounter: Payer: Self-pay | Admitting: Physician Assistant

## 2022-04-27 DIAGNOSIS — N184 Chronic kidney disease, stage 4 (severe): Secondary | ICD-10-CM | POA: Insufficient documentation
